# Patient Record
Sex: Female | Born: 1990 | Hispanic: Yes | Marital: Married | Smoking: Never smoker
Health system: Southern US, Community
[De-identification: ages and names within clinical notes are randomized; demographics above are authoritative.]

## PROBLEM LIST (undated history)

## (undated) DIAGNOSIS — Z6791 Unspecified blood type, Rh negative: Secondary | ICD-10-CM

## (undated) DIAGNOSIS — O26899 Other specified pregnancy related conditions, unspecified trimester: Secondary | ICD-10-CM

## (undated) DIAGNOSIS — M419 Scoliosis, unspecified: Secondary | ICD-10-CM

## (undated) HISTORY — PX: NO PAST SURGERIES: SHX2092

## (undated) HISTORY — PX: DILATION AND CURETTAGE OF UTERUS: SHX78

## (undated) HISTORY — DX: Scoliosis, unspecified: M41.9

## (undated) HISTORY — DX: Unspecified blood type, rh negative: Z67.91

## (undated) HISTORY — DX: Other specified pregnancy related conditions, unspecified trimester: O26.899

---

## 2021-11-01 NOTE — L&D Delivery Note (Signed)
Delivery Note  First Stage: Labor onset: 10/17/22  at 2320 Augmentation: PItocin, AROM forebag Analgesia /Anesthesia intrapartum: epidural SROM 10/17/22 at 2320  Second Stage: Complete dilation at 1533 Onset of pushing at 1535 FHR second stage Cat II- variables  Delivery of a viable female infant on 10/18/22 at 1543 by CNM delivery of fetal head in ROA position with restitution to ROT. No nuchal cord;  Anterior then posterior shoulders delivered easily with gentle downward traction. Baby placed on mom's chest, and attended to by peds.  Cord double clamped after cessation of pulsation, cut by FOB Cord blood sample collected    Third Stage: Placenta delivered spontaneously intact with 3VC @ 1546 Placenta disposition: routine disposal Uterine tone Firm / bleeding small  NO vaginal, perineal or cervical lacerations identified  Anesthesia for repair: n/a Repair n/a Est. Blood Loss (mL): 200  Complications: none  Mom to postpartum.  Baby to Couplet care / Skin to Skin.  Newborn: Birth Weight: 6#6  Apgar Scores: 8/9 Feeding planned: breast

## 2021-12-05 ENCOUNTER — Emergency Department
Admission: EM | Admit: 2021-12-05 | Discharge: 2021-12-05 | Disposition: A | Discharge status: Home / Self Care | Payer: Self-pay | Attending: Emergency Medicine | Admitting: Emergency Medicine

## 2021-12-05 DIAGNOSIS — R000 Tachycardia, unspecified: Secondary | ICD-10-CM | POA: Insufficient documentation

## 2021-12-05 DIAGNOSIS — R0602 Shortness of breath: Secondary | ICD-10-CM | POA: Insufficient documentation

## 2021-12-05 DIAGNOSIS — T6111XA Scombroid fish poisoning, accidental (unintentional), initial encounter: Secondary | ICD-10-CM | POA: Insufficient documentation

## 2021-12-05 LAB — CBC WITH DIFFERENTIAL/PLATELET
Abs Immature Granulocytes: 0.03 10*3/uL (ref 0.00–0.07)
Basophils Absolute: 0.1 10*3/uL (ref 0.0–0.1)
Basophils Relative: 1 %
Eosinophils Absolute: 0.1 10*3/uL (ref 0.0–0.5)
Eosinophils Relative: 1 %
HCT: 37.1 % (ref 36.0–46.0)
Hemoglobin: 12.5 g/dL (ref 12.0–15.0)
Immature Granulocytes: 0 %
Lymphocytes Relative: 37 %
Lymphs Abs: 4.6 10*3/uL — ABNORMAL HIGH (ref 0.7–4.0)
MCH: 27.7 pg (ref 26.0–34.0)
MCHC: 33.7 g/dL (ref 30.0–36.0)
MCV: 82.1 fL (ref 80.0–100.0)
Monocytes Absolute: 0.8 10*3/uL (ref 0.1–1.0)
Monocytes Relative: 6 %
Neutro Abs: 7 10*3/uL (ref 1.7–7.7)
Neutrophils Relative %: 55 %
Platelets: 322 10*3/uL (ref 150–400)
RBC: 4.52 MIL/uL (ref 3.87–5.11)
RDW: 11.6 % (ref 11.5–15.5)
WBC: 12.6 10*3/uL — ABNORMAL HIGH (ref 4.0–10.5)
nRBC: 0 % (ref 0.0–0.2)

## 2021-12-05 LAB — COMPREHENSIVE METABOLIC PANEL
ALT: 27 U/L (ref 0–44)
AST: 32 U/L (ref 15–41)
Albumin: 3.9 g/dL (ref 3.5–5.0)
Alkaline Phosphatase: 66 U/L (ref 38–126)
Anion gap: 8 (ref 5–15)
BUN: 20 mg/dL (ref 6–20)
CO2: 21 mmol/L — ABNORMAL LOW (ref 22–32)
Calcium: 8.9 mg/dL (ref 8.9–10.3)
Chloride: 106 mmol/L (ref 98–111)
Creatinine, Ser: 0.63 mg/dL (ref 0.44–1.00)
GFR, Estimated: >60 mL/min (ref >60)
Glucose, Bld: 103 mg/dL — ABNORMAL HIGH (ref 70–99)
Potassium: 3.6 mmol/L (ref 3.5–5.1)
Sodium: 135 mmol/L (ref 135–145)
Total Bilirubin: 0.8 mg/dL (ref 0.3–1.2)
Total Protein: 7.9 g/dL (ref 6.5–8.1)

## 2021-12-05 MED ORDER — FAMOTIDINE IN NACL 20-0.9 MG/50ML-% IV SOLN
20.0000 mg | Freq: Once | INTRAVENOUS | Status: AC
  Administered 2021-12-05: 20 mg via INTRAVENOUS
  Filled 2021-12-05: qty 50

## 2021-12-05 MED ORDER — LACTATED RINGERS IV BOLUS
1000.0000 mL | Freq: Once | INTRAVENOUS | Status: AC
  Administered 2021-12-05: 1000 mL via INTRAVENOUS

## 2021-12-05 MED ORDER — DIPHENHYDRAMINE HCL 50 MG/ML IJ SOLN
25.0000 mg | Freq: Once | INTRAMUSCULAR | Status: AC
  Administered 2021-12-05: 25 mg via INTRAVENOUS
  Filled 2021-12-05: qty 1

## 2021-12-05 NOTE — ED Triage Notes (Signed)
PT arrived via EMS today for a allergic reaction to shrimp. Per EMS pt received of NS with 50mg  of benadryl, 125mg  of Solu-Medrol, 1mg  of Recemic Epi, 0.3mg  of IM Epi, and 4mg  of zofran via a 20g IV in the RAC. Pt is resting on stretcher in NAD, breathing is normal and non labored.

## 2021-12-05 NOTE — Discharge Instructions (Addendum)
Use Benadryl as needed for any further itching or rashes.  Use 1-2 tablets per dose (25-50 mg) and you can take up to 3-4 times per day.  Return to the ED with any worsening symptoms despite these measures.

## 2021-12-05 NOTE — ED Provider Notes (Signed)
[facility] Provider Note    Event Date/Time   First MD Initiated Contact with Patient 12/05/21 1753     (approximate)   History   Allergic Reaction   HPI  Rachel Espinoza is a 31 y.o. female who presents to the ED for evaluation of Allergic Reaction   No hx in our chart.  Patient self-reports no medical history.  No allergies and she has tolerated strep a multitude of times in the past without issue.  She reports eating shrimp at home by herself this evening and within 30 minutes after ingestion, she developed throat closure sensation, hives, perioral paresthesias and shortness of breath.  EMS was called and provided multiple medications to treat anaphylaxis prehospital: 50 mg of IV Benadryl, 125 mg of Solu-Medrol, 1 mg of racemic epi nebulization and 0.3 mg IM epi.  4 mg of Zofran.  She reports feeling better now, just jittery.  No recent illnesses.   Physical Exam   Triage Vital Signs: ED Triage Vitals  Enc Vitals Group     BP      Pulse      Resp      Temp      Temp src      SpO2      Weight      Height      Head Circumference      Peak Flow      Pain Score      Pain Loc      Pain Edu?      Excl. in GC?     Most recent vital signs: Vitals:   12/05/21 1845 12/05/21 1900  BP:  113/70  Pulse: 100 97  Resp: (!) 21 (!) 23  SpO2: 100% 99%    General: Awake, no distress.  Able to open her mouth widely with Mallampati 1 CV:  Good peripheral perfusion.  Tachycardic and regular Resp:  Normal effort.  No wheezing Abd:  No distention.  Soft and benign throughout MSK:  No deformity noted.  No rash or hives noted Neuro:  No focal deficits appreciated. Other:     ED Results / Procedures / Treatments   Labs (all labs ordered are listed, but only abnormal results are displayed) Labs Reviewed  CBC WITH DIFFERENTIAL/PLATELET - Abnormal; Notable for the following components:      Result Value   WBC 12.6 (*)    Lymphs Abs 4.6 (*)     All other components within normal limits  COMPREHENSIVE METABOLIC PANEL - Abnormal; Notable for the following components:   CO2 21 (*)    Glucose, Bld 103 (*)    All other components within normal limits    EKG Sinus tachycardia with a rate of 111 bpm.  Normal axis and intervals.  No evidence of acute ischemia.  RADIOLOGY   Official radiology report(s): No results found.  PROCEDURES and INTERVENTIONS:  .1-3 Lead EKG Interpretation Performed by: Delton Prairie, MD Authorized by: Delton Prairie, MD     Interpretation: abnormal     ECG rate:  104   ECG rate assessment: tachycardic     Rhythm: sinus tachycardia     Ectopy: none     Conduction: normal    Medications  lactated ringers bolus 1,000 mL (1,000 mLs Intravenous New Bag/Given 12/05/21 1805)  famotidine (PEPCID) IVPB 20 mg premix (0 mg Intravenous Stopped 12/05/21 1834)     IMPRESSION / MDM / ASSESSMENT AND PLAN / ED COURSE  I reviewed the  triage vital signs and the nursing notes.  Healthy 31 year old female presents to the ED with allergic reaction after ingesting shrimp, likely scombroid and requiring observation in the ED due to prehospital epinephrine.  She is somewhat jittery and tachycardic on arrival, anticipate this is due to the epinephrine she received prehospital.  She has no evidence of persistent allergic reaction, hives, anaphylaxis or need for recurrent doses of epi at this time.  We will round out her antihistamine medications with famotidine IV as we observe her for a couple hours to ensure no need for repeat dosing of these medications.  Clinical Course as of 12/05/21 1959  Sat Dec 05, 2021  1920 Reassessed. Feeling well [DS]  1959 Reassessed.  Feeling well.  We again discussed scombroid.  Benadryl at home and return precautions for the ED. [DS]    Clinical Course User Index [DS] Delton Prairie, MD     FINAL CLINICAL IMPRESSION(S) / ED DIAGNOSES   Final diagnoses:  Unintentional scombroid fish  poisoning, initial encounter     Rx / DC Orders   ED Discharge Orders     None        Note:  This document was prepared using Dragon voice recognition software and may include unintentional dictation errors.   Delton Prairie, MD 12/05/21 [phone]

## 2022-04-12 ENCOUNTER — Encounter: Payer: Self-pay | Admitting: Family Medicine

## 2022-04-12 ENCOUNTER — Ambulatory Visit: Payer: Medicaid Other | Admitting: Family Medicine

## 2022-04-12 VITALS — BP 102/63 | HR 85 | Temp 97.6°F | Ht 58.5 in | Wt 158.2 lb

## 2022-04-12 DIAGNOSIS — M419 Scoliosis, unspecified: Secondary | ICD-10-CM | POA: Diagnosis not present

## 2022-04-12 DIAGNOSIS — F32A Depression, unspecified: Secondary | ICD-10-CM

## 2022-04-12 DIAGNOSIS — O0991 Supervision of high risk pregnancy, unspecified, first trimester: Secondary | ICD-10-CM

## 2022-04-12 DIAGNOSIS — Z91410 Personal history of adult physical and sexual abuse: Secondary | ICD-10-CM | POA: Insufficient documentation

## 2022-04-12 DIAGNOSIS — O99211 Obesity complicating pregnancy, first trimester: Secondary | ICD-10-CM | POA: Diagnosis not present

## 2022-04-12 DIAGNOSIS — O0993 Supervision of high risk pregnancy, unspecified, third trimester: Secondary | ICD-10-CM | POA: Insufficient documentation

## 2022-04-12 DIAGNOSIS — O09213 Supervision of pregnancy with history of pre-term labor, third trimester: Secondary | ICD-10-CM | POA: Insufficient documentation

## 2022-04-12 HISTORY — DX: Depression, unspecified: F32.A

## 2022-04-12 LAB — URINALYSIS
Bilirubin, UA: NEGATIVE
Glucose, UA: NEGATIVE
Ketones, UA: NEGATIVE
Leukocytes,UA: NEGATIVE
Nitrite, UA: NEGATIVE
Protein,UA: NEGATIVE
RBC, UA: NEGATIVE
Specific Gravity, UA: 1.03 (ref 1.005–1.030)
Urobilinogen, Ur: 0.2 mg/dL (ref 0.2–1.0)
pH, UA: 5.5 (ref 5.0–7.5)

## 2022-04-12 LAB — WET PREP FOR TRICH, YEAST, CLUE
Trichomonas Exam: NEGATIVE
Yeast Exam: NEGATIVE

## 2022-04-12 LAB — HEMOGLOBIN, FINGERSTICK: Hemoglobin: 13.3 g/dL (ref 11.1–15.9)

## 2022-04-12 NOTE — Progress Notes (Signed)
BMI 31.8. Patient ingested glucola within allotted timeframe and tolerated well.   Patient declines completion of IPV questions. Provider aware. [facility] Spanish utilized during visit .  [place] Spanish Interpretor utilized during visit [phone]). Patient changed delivery provider to [facility]. [place] OB packet given and discussed contents of packet. Scheduled for return OB visit in 4 weeks. Provider aware of In House labs.  Alesia Banda RN
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
## 2022-04-13 ENCOUNTER — Emergency Department
Admission: EM | Admit: 2022-04-13 | Discharge: 2022-04-13 | Disposition: A | Discharge status: Home / Self Care | Payer: Self-pay | Attending: Student in an Organized Health Care Education/Training Program | Admitting: Student in an Organized Health Care Education/Training Program

## 2022-04-13 ENCOUNTER — Encounter: Payer: Self-pay | Admitting: Intensive Care

## 2022-04-13 ENCOUNTER — Other Ambulatory Visit: Payer: Self-pay

## 2022-04-13 ENCOUNTER — Emergency Department: Payer: Self-pay

## 2022-04-13 DIAGNOSIS — Z3A11 11 weeks gestation of pregnancy: Secondary | ICD-10-CM | POA: Insufficient documentation

## 2022-04-13 DIAGNOSIS — O4690 Antepartum hemorrhage, unspecified, unspecified trimester: Secondary | ICD-10-CM

## 2022-04-13 DIAGNOSIS — O208 Other hemorrhage in early pregnancy: Secondary | ICD-10-CM | POA: Insufficient documentation

## 2022-04-13 DIAGNOSIS — O418X11 Other specified disorders of amniotic fluid and membranes, first trimester, fetus 1: Secondary | ICD-10-CM

## 2022-04-13 LAB — COMPREHENSIVE METABOLIC PANEL
ALT: 21 U/L (ref 0–44)
AST: 21 U/L (ref 15–41)
Albumin: 3.7 g/dL (ref 3.5–5.0)
Alkaline Phosphatase: 57 U/L (ref 38–126)
Anion gap: 6 (ref 5–15)
BUN: 11 mg/dL (ref 6–20)
CO2: 21 mmol/L — ABNORMAL LOW (ref 22–32)
Calcium: 9.1 mg/dL (ref 8.9–10.3)
Chloride: 108 mmol/L (ref 98–111)
Creatinine, Ser: 0.38 mg/dL — ABNORMAL LOW (ref 0.44–1.00)
GFR, Estimated: >60 mL/min (ref >60)
Glucose, Bld: 83 mg/dL (ref 70–99)
Potassium: 4.4 mmol/L (ref 3.5–5.1)
Sodium: 135 mmol/L (ref 135–145)
Total Bilirubin: 0.4 mg/dL (ref 0.3–1.2)
Total Protein: 7.5 g/dL (ref 6.5–8.1)

## 2022-04-13 LAB — URINALYSIS, ROUTINE W REFLEX MICROSCOPIC
Bilirubin Urine: NEGATIVE
Glucose, UA: NEGATIVE mg/dL
Hgb urine dipstick: NEGATIVE
Ketones, ur: NEGATIVE mg/dL
Leukocytes,Ua: NEGATIVE
Nitrite: NEGATIVE
Protein, ur: NEGATIVE mg/dL
Specific Gravity, Urine: 1.025 (ref 1.005–1.030)
pH: 5 (ref 5.0–8.0)

## 2022-04-13 LAB — CBC
HCT: 36.9 % (ref 36.0–46.0)
Hemoglobin: 12.3 g/dL (ref 12.0–15.0)
MCH: 27.3 pg (ref 26.0–34.0)
MCHC: 33.3 g/dL (ref 30.0–36.0)
MCV: 82 fL (ref 80.0–100.0)
Platelets: 294 10*3/uL (ref 150–400)
RBC: 4.5 MIL/uL (ref 3.87–5.11)
RDW: 12.3 % (ref 11.5–15.5)
WBC: 11.7 10*3/uL — ABNORMAL HIGH (ref 4.0–10.5)
nRBC: 0 % (ref 0.0–0.2)

## 2022-04-13 LAB — PROTEIN / CREATININE RATIO, URINE
Creatinine, Urine: 96.8 mg/dL
Protein, Ur: 9.5 mg/dL
Protein/Creat Ratio: 98 mg/g creat (ref 0–200)

## 2022-04-13 LAB — HCG, QUANTITATIVE, PREGNANCY: hCG, Beta Chain, Quant, S: 61870 m[IU]/mL — ABNORMAL HIGH (ref <5)

## 2022-04-13 LAB — POC URINE PREG, ED: Preg Test, Ur: POSITIVE — AB

## 2022-04-13 LAB — ABO/RH: ABO/RH(D): O NEG

## 2022-04-13 LAB — LIPASE, BLOOD: Lipase: 34 U/L (ref 11–51)

## 2022-04-13 MED ORDER — ACETAMINOPHEN 325 MG PO TABS
650.0000 mg | ORAL_TABLET | Freq: Once | ORAL | Status: AC
  Administered 2022-04-13: 650 mg via ORAL
  Filled 2022-04-13: qty 2

## 2022-04-13 NOTE — ED Provider Notes (Signed)
[facility] Provider Note    Event Date/Time   First MD Initiated Contact with Patient 04/13/22 1357     (approximate)   History   Threatened Miscarriage   HPI  Rachel Espinoza is a 31 y.o. female  [phone] roughly [redacted] weeks pregnant presents to the ER for evaluation of 36 hours of pelvic cramping and vaginal bleeding.  States is primarily spotting.  Had follow-up with her [place] yesterday had exam that was otherwise reassuring but they were unable to auscultate fetal heart tones therefore given the bleeding and pain there was concern for miscarriage and they recommended follow-up in the ER.        Physical Exam   Triage Vital Signs: ED Triage Vitals  Enc Vitals Group     BP 04/13/22 1339 112/72     Pulse Rate 04/13/22 1339 94     Resp 04/13/22 1339 16     Temp 04/13/22 1339 98.3 F (36.8 C)     Temp Source 04/13/22 1339 Oral     SpO2 04/13/22 1339 100 %     Weight 04/13/22 1342 158 lb (71.7 kg)     Height 04/13/22 1342 5' 0.24" (1.53 m)     Head Circumference --      Peak Flow --      Pain Score 04/13/22 1341 7     Pain Loc --      Pain Edu? --      Excl. in [place]? --     Most recent vital signs: Vitals:   04/13/22 1339 04/13/22 1509  BP: 112/72 112/70  Pulse: 94 88  Resp: 16 16  Temp: 98.3 F (36.8 C)   SpO2: 100% 100%     Constitutional: Alert  Eyes: Conjunctivae are normal.  Head: Atraumatic. Nose: No congestion/rhinnorhea. Mouth/Throat: Mucous membranes are moist.   Neck: Painless ROM.  Cardiovascular:   Good peripheral circulation. Respiratory: Normal respiratory effort.  No retractions.  Gastrointestinal: Soft, gravid, and nontender  Musculoskeletal:  no deformity Neurologic:  MAE spontaneously. No gross focal neurologic deficits are appreciated.  Skin:  Skin is warm, dry and intact. No rash noted. Psychiatric: Mood and affect are normal. Speech and behavior are normal.    ED Results /  Procedures / Treatments   Labs (all labs ordered are listed, but only abnormal results are displayed) Labs Reviewed  COMPREHENSIVE METABOLIC PANEL - Abnormal; Notable for the following components:      Result Value   CO2 21 (*)    Creatinine, Ser 0.38 (*)    All other components within normal limits  CBC - Abnormal; Notable for the following components:   WBC 11.7 (*)    All other components within normal limits  URINALYSIS, ROUTINE W REFLEX MICROSCOPIC - Abnormal; Notable for the following components:   Color, Urine YELLOW (*)    APPearance CLEAR (*)    All other components within normal limits  HCG, QUANTITATIVE, PREGNANCY - Abnormal; Notable for the following components:   hCG, Beta Chain, Quant, S 61,870 (*)    All other components within normal limits  POC URINE PREG, ED - Abnormal; Notable for the following components:   Preg Test, Ur POSITIVE (*)    All other components within normal limits  LIPASE, BLOOD  ABO/RH     EKG     RADIOLOGY Please see ED Course for my review and interpretation.  I personally reviewed all radiographic images ordered to evaluate for the  above acute complaints and reviewed radiology reports and findings.  These findings were personally discussed with the patient.  Please see medical record for radiology report.    PROCEDURES:  Critical Care performed: No  Procedures   MEDICATIONS ORDERED IN ED: Medications  acetaminophen (TYLENOL) tablet 650 mg (650 mg Oral Given 04/13/22 1415)     IMPRESSION / MDM / ASSESSMENT AND PLAN / ED COURSE  I reviewed the triage vital signs and the nursing notes.                              Differential diagnosis includes, but is not limited to, ectopic, threatened AB, miscarriage, AUB, abruption  Presented to the ER for evaluation of symptoms as described above.  Clinically well-appearing in no acute distress.  Will check blood work.  Will order ultrasound to evaluate for the but differential.  This  presenting complaint could reflect a potentially life-threatening illness.    Clinical Course as of 04/13/22 1511  Tue Apr 13, 2022  1448 US pelvis by my interpretation does show evidence of IUP with reassuring fetal heart tones. [PR]  1509 Ultrasound reassuring.  Discussed findings of subchorionic hematoma.  Patient hemodynamically stable.  No additional questions or concerns. [PR]    Clinical Course User Index [PR] Merlyn Lot, MD      FINAL CLINICAL IMPRESSION(S) / ED DIAGNOSES   Final diagnoses:  Vaginal bleeding in pregnancy  Subchorionic hematoma in first trimester, fetus 1 of multiple gestation     Rx / DC Orders   ED Discharge Orders     None        Note:  This document was prepared using Dragon voice recognition software and may include unintentional dictation errors.     Merlyn Lot, MD 04/13/22 1511

## 2022-04-13 NOTE — ED Triage Notes (Signed)
Patient reports going to her OBGYN appointment yesterday at 11weeks and they could not find a heartbeat and was told to come to ER. C/o lower pelvic pain X3 days and vaginal spotting X1 day

## 2022-04-13 NOTE — Discharge Instructions (Signed)
LO QUE NECESITA SABER: Una hemorragia subcorinica, o hematoma, es una acumulacin de [employer] la placenta y Careers information officer. Esta hemorragia generalmente se desarrolla a finales del primer trimestre. El sangrado casi siempre es absorbido por su propio cuerpo usualmente a las 20 semanas de [place]. La mayora de los embarazos progresan sin problemas. Es probable que [employer] ocasional o sangrado ligero a lo largo de Engineer, civil (consulting). INSTRUCCIONES SOBRE EL ALTA HOSPITALARIA: Regrese a la sala de emergencias si: Tiene fiebre. Usted tiene dolor abdominal. Usted tiene un aumento repentino en el sangrado. Comunquese con su mdico si: Su sangrado ha aumentado. Usted tiene preguntas o inquietudes acerca de su condicin o cuidado. Descanso plvico: No tenga relaciones sexuales, no tome duchas vaginales ni use tampones. No haga esfuerzo ni alce objetos pesados. Estas actividades pueden ocasionar contracciones o infecciones y Hospital doctor en riesgo su vida y la de su beb. Es probable que usted necesite descansar ms de lo normal. Haga sus actividades diarias segn indicaciones dadas. Acuda a sus consultas de control con su mdico segn le indicaron. Es posible que necesite regresar con frecuencia para Engineer, structural. Anote sus preguntas para que se acuerde de hacerlas durante sus visitas.

## 2022-04-13 NOTE — ED Notes (Signed)
See triage note  presents with some lower abd cramping and pain denies any vaginal bleeding  states she is [redacted] weeks pregnant  was told that her OB was not able to hear any heart tones

## 2022-04-13 NOTE — Addendum Note (Signed)
Addended by: Wendi Snipes on: 04/13/2022 05:40 PM   Modules accepted: Orders

## 2022-04-14 LAB — CHLAMYDIA/GC NAA, CONFIRMATION
Chlamydia trachomatis, NAA: NEGATIVE
Neisseria gonorrhoeae, NAA: NEGATIVE

## 2022-04-14 LAB — URINE CULTURE

## 2022-04-15 DIAGNOSIS — O26899 Other specified pregnancy related conditions, unspecified trimester: Secondary | ICD-10-CM | POA: Insufficient documentation

## 2022-04-15 LAB — COMPREHENSIVE METABOLIC PANEL
ALT: 20 IU/L (ref 0–32)
AST: 20 IU/L (ref 0–40)
Albumin/Globulin Ratio: 1.3 (ref 1.2–2.2)
Albumin: 4.4 g/dL (ref 3.9–5.0)
Alkaline Phosphatase: 82 IU/L (ref 44–121)
BUN/Creatinine Ratio: 23 (ref 9–23)
BUN: 11 mg/dL (ref 6–20)
Bilirubin Total: <0.2 mg/dL (ref 0.0–1.2)
CO2: 19 mmol/L — ABNORMAL LOW (ref 20–29)
Calcium: 9.8 mg/dL (ref 8.7–10.2)
Chloride: 98 mmol/L (ref 96–106)
Creatinine, Ser: 0.48 mg/dL — ABNORMAL LOW (ref 0.57–1.00)
Globulin, Total: 3.4 g/dL (ref 1.5–4.5)
Glucose: 112 mg/dL — ABNORMAL HIGH (ref 70–99)
Potassium: 3.8 mmol/L (ref 3.5–5.2)
Sodium: 135 mmol/L (ref 134–144)
Total Protein: 7.8 g/dL (ref 6.0–8.5)
eGFR: 131 mL/min/{1.73_m2} (ref >59)

## 2022-04-15 LAB — QUANTIFERON-TB GOLD PLUS
QuantiFERON Mitogen Value: >10 IU/mL
QuantiFERON Nil Value: 0.03 IU/mL
QuantiFERON TB1 Ag Value: 0.04 IU/mL
QuantiFERON TB2 Ag Value: 0.03 IU/mL
QuantiFERON-TB Gold Plus: NEGATIVE

## 2022-04-15 LAB — CBC/D/PLT+RPR+RH+ABO+RUB AB...
Antibody Screen: NEGATIVE
Basophils Absolute: 0 10*3/uL (ref 0.0–0.2)
Basos: 0 %
EOS (ABSOLUTE): 0.1 10*3/uL (ref 0.0–0.4)
Eos: 1 %
Hematocrit: 40.5 % (ref 34.0–46.6)
Hemoglobin: 13.7 g/dL (ref 11.1–15.9)
Hepatitis B Surface Ag: NEGATIVE
Immature Grans (Abs): 0 10*3/uL (ref 0.0–0.1)
Immature Granulocytes: 0 %
Lymphocytes Absolute: 2.4 10*3/uL (ref 0.7–3.1)
Lymphs: 21 %
MCH: 28.1 pg (ref 26.6–33.0)
MCHC: 33.8 g/dL (ref 31.5–35.7)
MCV: 83 fL (ref 79–97)
Monocytes Absolute: 0.6 10*3/uL (ref 0.1–0.9)
Monocytes: 5 %
Neutrophils Absolute: 8.5 10*3/uL — ABNORMAL HIGH (ref 1.4–7.0)
Neutrophils: 73 %
Platelets: 304 10*3/uL (ref 150–450)
RBC: 4.87 x10E6/uL (ref 3.77–5.28)
RDW: 12.7 % (ref 11.7–15.4)
RPR Ser Ql: NONREACTIVE
Rh Factor: NEGATIVE
Rubella Antibodies, IGG: 5.11 index (ref >0.99)
Varicella zoster IgG: 1723 index (ref >165)
WBC: 11.6 10*3/uL — ABNORMAL HIGH (ref 3.4–10.8)

## 2022-04-15 LAB — TSH: TSH: 0.84 u[IU]/mL (ref 0.450–4.500)

## 2022-04-15 LAB — HIV-1/HIV-2 QUALITATIVE RNA
HIV-1 RNA, Qualitative: NONREACTIVE
HIV-2 RNA, Qualitative: NONREACTIVE

## 2022-04-15 LAB — HGB A1C W/O EAG: Hgb A1c MFr Bld: 5.1 % (ref 4.8–5.6)

## 2022-04-15 LAB — HCV INTERPRETATION

## 2022-04-15 LAB — HCV AB W REFLEX TO QUANT PCR: HCV Ab: NONREACTIVE

## 2022-04-15 LAB — SICKLE CELL SCREEN: Sickle Cell Screen: NEGATIVE

## 2022-04-15 LAB — GLUCOSE, 1 HOUR GESTATIONAL: Gestational Diabetes Screen: 97 mg/dL (ref 70–139)

## 2022-04-16 ENCOUNTER — Telehealth: Payer: Self-pay

## 2022-04-16 NOTE — Telephone Encounter (Signed)
Electronic referral for [facility] [place] for OB complete < 14 weeks ordered. Unable to be scheduled in [place] as schedule full. Call to [facility] in [place] and [place] scheduled for 04/27/2022 at 1015 with arrival time of 1000.  Call to client with Marlene Yemen and above [place] appt information given. Client given facility name and address. Jossie Ng, RN

## 2022-04-22 NOTE — Addendum Note (Signed)
Addended by: Heywood Bene on: 04/22/2022 02:29 PM   Modules accepted: Orders

## 2022-04-27 ENCOUNTER — Ambulatory Visit: Payer: Self-pay | Admitting: *Deleted

## 2022-04-27 ENCOUNTER — Encounter: Payer: Self-pay | Admitting: *Deleted

## 2022-04-27 ENCOUNTER — Ambulatory Visit: Payer: Self-pay | Attending: Family Medicine

## 2022-04-27 ENCOUNTER — Other Ambulatory Visit: Payer: Self-pay | Admitting: *Deleted

## 2022-04-27 ENCOUNTER — Encounter: Payer: Self-pay | Admitting: Advanced Practice Midwife

## 2022-04-27 VITALS — BP 114/65 | HR 98

## 2022-04-27 DIAGNOSIS — O26899 Other specified pregnancy related conditions, unspecified trimester: Secondary | ICD-10-CM | POA: Insufficient documentation

## 2022-04-27 DIAGNOSIS — O0991 Supervision of high risk pregnancy, unspecified, first trimester: Secondary | ICD-10-CM

## 2022-04-27 DIAGNOSIS — O09891 Supervision of other high risk pregnancies, first trimester: Secondary | ICD-10-CM

## 2022-04-27 DIAGNOSIS — O99211 Obesity complicating pregnancy, first trimester: Secondary | ICD-10-CM

## 2022-04-27 DIAGNOSIS — Z6791 Unspecified blood type, Rh negative: Secondary | ICD-10-CM | POA: Insufficient documentation

## 2022-04-27 DIAGNOSIS — O09213 Supervision of pregnancy with history of pre-term labor, third trimester: Secondary | ICD-10-CM

## 2022-04-27 DIAGNOSIS — O283 Abnormal ultrasonic finding on antenatal screening of mother: Secondary | ICD-10-CM

## 2022-05-11 ENCOUNTER — Telehealth: Payer: Self-pay

## 2022-05-11 ENCOUNTER — Ambulatory Visit: Payer: Medicaid Other

## 2022-05-11 NOTE — Telephone Encounter (Signed)
Call to client with Rachel Espinoza as [name] for [facility] RV today. Per client, she forgot appt. Appt rescheduled for 05/20/2022 with arrival time of 1020. Jossie Ng, RN

## 2022-05-19 ENCOUNTER — Ambulatory Visit: Payer: Medicaid Other | Admitting: Licensed Clinical Social Worker

## 2022-05-20 ENCOUNTER — Encounter: Payer: Self-pay | Admitting: Physician Assistant

## 2022-05-20 ENCOUNTER — Ambulatory Visit: Payer: Medicaid Other | Admitting: Physician Assistant

## 2022-05-20 VITALS — BP 98/57 | HR 92 | Temp 98.1°F | Wt 159.0 lb

## 2022-05-20 DIAGNOSIS — O99211 Obesity complicating pregnancy, first trimester: Secondary | ICD-10-CM

## 2022-05-20 DIAGNOSIS — O99212 Obesity complicating pregnancy, second trimester: Secondary | ICD-10-CM

## 2022-05-20 DIAGNOSIS — O208 Other hemorrhage in early pregnancy: Secondary | ICD-10-CM | POA: Insufficient documentation

## 2022-05-20 DIAGNOSIS — O0992 Supervision of high risk pregnancy, unspecified, second trimester: Secondary | ICD-10-CM | POA: Diagnosis not present

## 2022-05-20 DIAGNOSIS — O418X11 Other specified disorders of amniotic fluid and membranes, first trimester, fetus 1: Secondary | ICD-10-CM

## 2022-05-20 DIAGNOSIS — O0991 Supervision of high risk pregnancy, unspecified, first trimester: Secondary | ICD-10-CM

## 2022-05-20 DIAGNOSIS — O418X10 Other specified disorders of amniotic fluid and membranes, first trimester, not applicable or unspecified: Secondary | ICD-10-CM | POA: Insufficient documentation

## 2022-05-20 DIAGNOSIS — O468X1 Other antepartum hemorrhage, first trimester: Secondary | ICD-10-CM

## 2022-05-20 NOTE — Progress Notes (Signed)
Patient is here for MH RV at [redacted]w[redacted]d.   Patient states she is taking ASA 81mg  but forgets to take it daily. Encouraged importance of taking iron daily. Patient states she is taking PNV daily, no refills needed today.   Labs ordered today: MaterniT21, AFP open spina bifida, hemoglobinopathy, and antibody screen (per Dr. recommendation from ultrasound on 04/27/22). Patient aware she has Anatomy Ultrasound on 06/21/22 in [place].   Rh negative education done today and education sheet given.   Patient desires BTL PP. Patient has no health insurance (presumptive at the moment). [facility] application given and BTL booklet information given.   [facility], RN
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
## 2022-05-21 LAB — ANTIBODY SCREEN: Antibody Screen: NEGATIVE

## 2022-05-22 LAB — AFP, SERUM, OPEN SPINA BIFIDA
AFP MoM: 0.66
AFP Value: 20.4 ng/mL
Gest. Age on Collection Date: 15.4 weeks
Maternal Age At EDD: 31.4 yr
OSBR Risk 1 IN: 10000
Test Results:: NEGATIVE
Weight: 159 [lb_av]

## 2022-05-24 LAB — MATERNIT 21 PLUS CORE, BLOOD
Fetal Fraction: 9
Result (T21): NEGATIVE
Trisomy 13 (Patau syndrome): NEGATIVE
Trisomy 18 (Edwards syndrome): NEGATIVE
Trisomy 21 (Down syndrome): NEGATIVE

## 2022-05-24 LAB — HGB FRACTIONATION CASCADE
Hgb A2: 2.8 % (ref 1.8–3.2)
Hgb A: 97.2 % (ref 96.4–98.8)
Hgb F: 0 % (ref 0.0–2.0)
Hgb S: 0 %

## 2022-06-07 ENCOUNTER — Emergency Department
Admission: EM | Admit: 2022-06-07 | Discharge: 2022-06-08 | Disposition: A | Discharge status: Home / Self Care | Payer: Self-pay | Attending: Emergency Medicine | Admitting: Emergency Medicine

## 2022-06-07 DIAGNOSIS — R109 Unspecified abdominal pain: Secondary | ICD-10-CM

## 2022-06-07 DIAGNOSIS — U071 COVID-19: Secondary | ICD-10-CM | POA: Insufficient documentation

## 2022-06-07 DIAGNOSIS — O23592 Infection of other part of genital tract in pregnancy, second trimester: Secondary | ICD-10-CM | POA: Insufficient documentation

## 2022-06-07 DIAGNOSIS — N898 Other specified noninflammatory disorders of vagina: Secondary | ICD-10-CM | POA: Insufficient documentation

## 2022-06-07 DIAGNOSIS — O98512 Other viral diseases complicating pregnancy, second trimester: Secondary | ICD-10-CM | POA: Insufficient documentation

## 2022-06-07 DIAGNOSIS — B3731 Acute candidiasis of vulva and vagina: Secondary | ICD-10-CM | POA: Insufficient documentation

## 2022-06-07 DIAGNOSIS — O26892 Other specified pregnancy related conditions, second trimester: Secondary | ICD-10-CM | POA: Insufficient documentation

## 2022-06-07 DIAGNOSIS — O26899 Other specified pregnancy related conditions, unspecified trimester: Secondary | ICD-10-CM | POA: Insufficient documentation

## 2022-06-07 DIAGNOSIS — Z3A18 18 weeks gestation of pregnancy: Secondary | ICD-10-CM | POA: Insufficient documentation

## 2022-06-07 LAB — COMPREHENSIVE METABOLIC PANEL
ALT: 23 U/L (ref 0–44)
AST: 24 U/L (ref 15–41)
Albumin: 3.9 g/dL (ref 3.5–5.0)
Alkaline Phosphatase: 62 U/L (ref 38–126)
Anion gap: 9 (ref 5–15)
BUN: 7 mg/dL (ref 6–20)
CO2: 20 mmol/L — ABNORMAL LOW (ref 22–32)
Calcium: 9.1 mg/dL (ref 8.9–10.3)
Chloride: 103 mmol/L (ref 98–111)
Creatinine, Ser: 0.46 mg/dL (ref 0.44–1.00)
GFR, Estimated: >60 mL/min (ref >60)
Glucose, Bld: 88 mg/dL (ref 70–99)
Potassium: 3.8 mmol/L (ref 3.5–5.1)
Sodium: 132 mmol/L — ABNORMAL LOW (ref 135–145)
Total Bilirubin: 0.5 mg/dL (ref 0.3–1.2)
Total Protein: 8 g/dL (ref 6.5–8.1)

## 2022-06-07 LAB — CBC WITH DIFFERENTIAL/PLATELET
Abs Immature Granulocytes: 0.05 10*3/uL (ref 0.00–0.07)
Basophils Absolute: 0 10*3/uL (ref 0.0–0.1)
Basophils Relative: 0 %
Eosinophils Absolute: 0.1 10*3/uL (ref 0.0–0.5)
Eosinophils Relative: 1 %
HCT: 38.9 % (ref 36.0–46.0)
Hemoglobin: 13 g/dL (ref 12.0–15.0)
Immature Granulocytes: 1 %
Lymphocytes Relative: 6 %
Lymphs Abs: 0.6 10*3/uL — ABNORMAL LOW (ref 0.7–4.0)
MCH: 27.8 pg (ref 26.0–34.0)
MCHC: 33.4 g/dL (ref 30.0–36.0)
MCV: 83.3 fL (ref 80.0–100.0)
Monocytes Absolute: 0.5 10*3/uL (ref 0.1–1.0)
Monocytes Relative: 5 %
Neutro Abs: 8.8 10*3/uL — ABNORMAL HIGH (ref 1.7–7.7)
Neutrophils Relative %: 87 %
Platelets: 222 10*3/uL (ref 150–400)
RBC: 4.67 MIL/uL (ref 3.87–5.11)
RDW: 12.9 % (ref 11.5–15.5)
WBC: 9.9 10*3/uL (ref 4.0–10.5)
nRBC: 0 % (ref 0.0–0.2)

## 2022-06-07 LAB — CHLAMYDIA/NGC RT PCR (ARMC ONLY)
Chlamydia Tr: NOT DETECTED
N gonorrhoeae: NOT DETECTED

## 2022-06-07 LAB — URINALYSIS, ROUTINE W REFLEX MICROSCOPIC
Bilirubin Urine: NEGATIVE
Glucose, UA: NEGATIVE mg/dL
Hgb urine dipstick: NEGATIVE
Ketones, ur: NEGATIVE mg/dL
Nitrite: NEGATIVE
Protein, ur: NEGATIVE mg/dL
Specific Gravity, Urine: 1.023 (ref 1.005–1.030)
pH: 5 (ref 5.0–8.0)

## 2022-06-07 LAB — WET PREP, GENITAL
Clue Cells Wet Prep HPF POC: NONE SEEN
Sperm: NONE SEEN
Trich, Wet Prep: NONE SEEN
WBC, Wet Prep HPF POC: >=10 — AB (ref <10)

## 2022-06-07 LAB — HCG, QUANTITATIVE, PREGNANCY: hCG, Beta Chain, Quant, S: 4963 m[IU]/mL — ABNORMAL HIGH (ref <5)

## 2022-06-07 LAB — RESP PANEL BY RT-PCR (FLU A&B, COVID) ARPGX2
Influenza A by PCR: NEGATIVE
Influenza B by PCR: NEGATIVE
SARS Coronavirus 2 by RT PCR: POSITIVE — AB

## 2022-06-07 LAB — TYPE AND SCREEN
ABO/RH(D): O NEG
Antibody Screen: NEGATIVE

## 2022-06-07 MED ORDER — CLOTRIMAZOLE 1 % VA CREA
1.0000 | TOPICAL_CREAM | Freq: Every day | VAGINAL | 0 refills | Status: AC
Start: 2022-06-07 — End: 2022-06-14

## 2022-06-07 MED ORDER — SODIUM CHLORIDE 0.9 % IV BOLUS
1000.0000 mL | Freq: Once | INTRAVENOUS | Status: AC
  Administered 2022-06-07: 1000 mL via INTRAVENOUS

## 2022-06-07 MED ORDER — METOCLOPRAMIDE HCL 10 MG PO TABS
10.0000 mg | ORAL_TABLET | Freq: Once | ORAL | Status: AC
  Administered 2022-06-07: 10 mg via ORAL
  Filled 2022-06-07: qty 1

## 2022-06-07 MED ORDER — ACETAMINOPHEN 500 MG PO TABS
1000.0000 mg | ORAL_TABLET | Freq: Once | ORAL | Status: AC
  Administered 2022-06-07: 1000 mg via ORAL

## 2022-06-07 MED ORDER — ACETAMINOPHEN 500 MG PO TABS
ORAL_TABLET | ORAL | Status: AC
  Filled 2022-06-07: qty 2
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
## 2022-06-07 NOTE — ED Provider Triage Note (Signed)
Emergency Medicine Provider Triage Evaluation Note  [facility] Rachel Espinoza , a 31 y.o. female  was evaluated in triage.  Pt complains of low abdominal pain with radiation into right flank. Also has had yellow discharge that is now red tinged. [redacted] weeks pregnant. G5P4.  Physical Exam  BP (!) 127/91 (BP Location: Left Arm)   Pulse (!) 138   Resp 18   Wt 72.1 kg   LMP 01/23/2022 (Exact Date)   SpO2 100%   BMI 30.81 kg/m  Gen:   Awake, no distress   Resp:  Normal effort  MSK:   Moves extremities without difficulty  Other:    Medical Decision Making  Medically screening exam initiated at 2:48 PM.  Appropriate orders placed.  Rachel Espinoza was informed that the remainder of the evaluation will be completed by another provider, this initial triage assessment does not replace that evaluation, and the importance of remaining in the ED until their evaluation is complete.    Chinita Pester, FNP 06/07/22 1451

## 2022-06-07 NOTE — Consult Note (Signed)
Reason for Consult: lower abdominal pain , back pain , vaginal discharge in pregnancy  Referring Physician: Dr. Mikki Santee [facility] Rachel Espinoza is an 31 y.o. female. G7P5 18 weeks 1 day, that presented to ED for lower abdominal pain , back pain , and greenish vaginal discharge. Concern for PID.      Past Medical History:  Diagnosis Date   Depression 04/12/2022   Rh negative state in antepartum period     Past Surgical History:  Procedure Laterality Date   DILATION AND CURETTAGE OF UTERUS     NO PAST SURGERIES      Family History  Problem Relation Age of Onset   Thyroid disease Mother    Healthy Father    Healthy Sister    Healthy Brother    Healthy Daughter    Healthy Son     Social History:  reports that she has never smoked. She has never been exposed to tobacco smoke. She has never used smokeless tobacco. She reports that she does not currently use alcohol. She reports that she does not use drugs.  Allergies:  Allergies  Allergen Reactions   Shrimp Flavor Shortness Of Breath    Allergic  to Shrimp    Medications: I have reviewed the patient's current medications.  Review of Systems  Blood pressure 103/61, pulse 88, temperature 98.1 F (36.7 C), temperature source Oral, resp. rate 18, weight 72.1 kg, last menstrual period 01/23/2022, SpO2 98 %. Physical Exam  Results for orders placed or performed during the hospital encounter of 06/07/22 (from the past 48 hour(s))  Urinalysis, Routine w reflex microscopic     Status: Abnormal   Collection Time: 06/07/22  2:26 PM  Result Value Ref Range   Color, Urine YELLOW (A) YELLOW   APPearance HAZY (A) CLEAR   Specific Gravity, Urine 1.023 1.005 - 1.030   pH 5.0 5.0 - 8.0   Glucose, UA NEGATIVE NEGATIVE mg/dL   Hgb urine dipstick NEGATIVE NEGATIVE   Bilirubin Urine NEGATIVE NEGATIVE   Ketones, ur NEGATIVE NEGATIVE mg/dL   Protein, ur NEGATIVE NEGATIVE mg/dL   Nitrite NEGATIVE NEGATIVE   Leukocytes,Ua LARGE (A)  NEGATIVE   RBC / HPF 0-5 0 - 5 RBC/hpf   WBC, UA 0-5 0 - 5 WBC/hpf   Bacteria, UA RARE (A) NONE SEEN   Squamous Epithelial / LPF 0-5 0 - 5   Mucus PRESENT     Comment: Performed at [facility], [address] [place], [place] [address]  Type and screen [facility]     Status: None   Collection Time: 06/07/22  2:45 PM  Result Value Ref Range   ABO/RH(D) O NEG    Antibody Screen NEG    Sample Expiration      06/10/2022,2359 Performed at [facility], [address] [place], [place] [address]   hCG, quantitative, pregnancy     Status: Abnormal   Collection Time: 06/07/22  2:45 PM  Result Value Ref Range   hCG, Beta Chain, Quant, S 4,963 (H) <5 mIU/mL    Comment:          GEST. AGE      CONC.  (mIU/mL)   <=1 WEEK        5 - 50     2 WEEKS       50 - 500     3 WEEKS       100 - 10,000     4 WEEKS  1,000 - 30,000     5 WEEKS     3,500 - 115,000   6-8 WEEKS     12,000 - 270,000    12 WEEKS     15,000 - 220,000        FEMALE AND NON-PREGNANT FEMALE:     LESS THAN 5 mIU/mL Performed at [facility], [address] [place], [place] [address]   CBC with Differential     Status: Abnormal   Collection Time: 06/07/22  2:46 PM  Result Value Ref Range   WBC 9.9 4.0 - 10.5 K/uL   RBC 4.67 3.87 - 5.11 MIL/uL   Hemoglobin 13.0 12.0 - 15.0 g/dL   HCT 47.4 25.9 - 56.3 %   MCV 83.3 80.0 - 100.0 fL   MCH 27.8 26.0 - 34.0 pg   MCHC 33.4 30.0 - 36.0 g/dL   RDW 87.5 64.3 - 32.9 %   Platelets 222 150 - 400 K/uL   nRBC 0.0 0.0 - 0.2 %   Neutrophils Relative % 87 %   Neutro Abs 8.8 (H) 1.7 - 7.7 K/uL   Lymphocytes Relative 6 %   Lymphs Abs 0.6 (L) 0.7 - 4.0 K/uL   Monocytes Relative 5 %   Monocytes Absolute 0.5 0.1 - 1.0 K/uL   Eosinophils Relative 1 %   Eosinophils Absolute 0.1 0.0 - 0.5 K/uL   Basophils Relative 0 %   Basophils Absolute 0.0 0.0 - 0.1 K/uL   Immature Granulocytes 1 %   Abs Immature Granulocytes 0.05 0.00 - 0.07  K/uL    Comment: Performed at [facility], [address] [place], [place] [address]  Comprehensive metabolic panel     Status: Abnormal   Collection Time: 06/07/22  2:46 PM  Result Value Ref Range   Sodium 132 (L) 135 - 145 mmol/L   Potassium 3.8 3.5 - 5.1 mmol/L   Chloride 103 98 - 111 mmol/L   CO2 20 (L) 22 - 32 mmol/L   Glucose, Bld 88 70 - 99 mg/dL    Comment: Glucose reference range applies only to samples taken after fasting for at least 8 hours.   BUN 7 6 - 20 mg/dL   Creatinine, Ser 1.66 0.44 - 1.00 mg/dL   Calcium 9.1 8.9 - 06.3 mg/dL   Total Protein 8.0 6.5 - 8.1 g/dL   Albumin 3.9 3.5 - 5.0 g/dL   AST 24 15 - 41 U/L   ALT 23 0 - 44 U/L   Alkaline Phosphatase 62 38 - 126 U/L   Total Bilirubin 0.5 0.3 - 1.2 mg/dL   GFR, Estimated >01 >60 mL/min    Comment: (NOTE) Calculated using the CKD-EPI Creatinine Equation (2021)    Anion gap 9 5 - 15    Comment: Performed at [facility], [address] [place], [place] [address]    No results found.  Assessment/Plan: Afebrile , normal white count. Wet prep pending, U/A -large leukocytes, urine culture pending, GC/Chlamydia cultures pending.   If wet prep comes back positive then treat as appropriate . The health department to follow up with  GC/Chlamydia & urine culture results and treat as needed. . The low abdominal pain and back pain are associated with round ligament pain of pregnancy, which can be treated with heat / ice to her back and tylenol as needed.   Dr. Logan Bores consulted on plan of care and in agreement.   Doreene Burke, CNM  06/07/2022

## 2022-06-07 NOTE — ED Triage Notes (Addendum)
Pt to ED via POV form home. Pt currently [redacted] weeks pregnant with 5th pregnancy. Pt reports lower abdominal, back pain, epigastric pain, HA. Pt reports vaginal drainage started yellow in color and has noted some bleeding. Pt reports has not noticed fetal movement today but states it could be due to being in so much pain.   Unable to obtain FHR tones in triage by this RN or NP

## 2022-06-07 NOTE — ED Notes (Signed)
RN went to go discharge pt and her HR is 130s- 120s. Provider informed.

## 2022-06-09 ENCOUNTER — Ambulatory Visit: Payer: Self-pay | Admitting: Licensed Clinical Social Worker

## 2022-06-11 LAB — URINE CULTURE

## 2022-06-18 ENCOUNTER — Ambulatory Visit: Payer: Self-pay

## 2022-06-18 ENCOUNTER — Telehealth: Payer: Self-pay | Admitting: Family Medicine

## 2022-06-18 NOTE — Telephone Encounter (Signed)
Schedule pt for her follow up for the 22nd

## 2022-06-21 ENCOUNTER — Other Ambulatory Visit: Payer: Self-pay | Admitting: *Deleted

## 2022-06-21 ENCOUNTER — Encounter: Payer: Self-pay | Admitting: *Deleted

## 2022-06-21 ENCOUNTER — Ambulatory Visit: Payer: Self-pay | Admitting: *Deleted

## 2022-06-21 ENCOUNTER — Ambulatory Visit: Payer: Self-pay | Attending: Obstetrics and Gynecology

## 2022-06-21 VITALS — BP 114/63 | HR 91

## 2022-06-21 DIAGNOSIS — O09212 Supervision of pregnancy with history of pre-term labor, second trimester: Secondary | ICD-10-CM

## 2022-06-21 DIAGNOSIS — Z3A20 20 weeks gestation of pregnancy: Secondary | ICD-10-CM

## 2022-06-21 DIAGNOSIS — E669 Obesity, unspecified: Secondary | ICD-10-CM

## 2022-06-21 DIAGNOSIS — Z6791 Unspecified blood type, Rh negative: Secondary | ICD-10-CM

## 2022-06-21 DIAGNOSIS — O99212 Obesity complicating pregnancy, second trimester: Secondary | ICD-10-CM

## 2022-06-21 DIAGNOSIS — O360120 Maternal care for anti-D [Rh] antibodies, second trimester, not applicable or unspecified: Secondary | ICD-10-CM

## 2022-06-21 DIAGNOSIS — O99211 Obesity complicating pregnancy, first trimester: Secondary | ICD-10-CM | POA: Insufficient documentation

## 2022-06-21 DIAGNOSIS — O26899 Other specified pregnancy related conditions, unspecified trimester: Secondary | ICD-10-CM | POA: Insufficient documentation

## 2022-06-21 DIAGNOSIS — O09899 Supervision of other high risk pregnancies, unspecified trimester: Secondary | ICD-10-CM

## 2022-06-21 DIAGNOSIS — O09891 Supervision of other high risk pregnancies, first trimester: Secondary | ICD-10-CM | POA: Insufficient documentation

## 2022-06-21 DIAGNOSIS — O283 Abnormal ultrasonic finding on antenatal screening of mother: Secondary | ICD-10-CM | POA: Insufficient documentation

## 2022-06-22 ENCOUNTER — Ambulatory Visit: Payer: Self-pay

## 2022-06-23 ENCOUNTER — Telehealth: Payer: Self-pay | Admitting: Family Medicine

## 2022-06-23 NOTE — Telephone Encounter (Signed)
Reschedule pt for missed appt.

## 2022-06-28 ENCOUNTER — Ambulatory Visit: Payer: Self-pay | Admitting: Advanced Practice Midwife

## 2022-06-28 ENCOUNTER — Encounter: Payer: Self-pay | Admitting: Advanced Practice Midwife

## 2022-06-28 VITALS — BP 96/55 | HR 86 | Temp 97.4°F | Wt 159.6 lb

## 2022-06-28 DIAGNOSIS — O0992 Supervision of high risk pregnancy, unspecified, second trimester: Secondary | ICD-10-CM

## 2022-06-28 DIAGNOSIS — O99211 Obesity complicating pregnancy, first trimester: Secondary | ICD-10-CM

## 2022-06-28 DIAGNOSIS — M419 Scoliosis, unspecified: Secondary | ICD-10-CM

## 2022-06-28 DIAGNOSIS — B379 Candidiasis, unspecified: Secondary | ICD-10-CM

## 2022-06-28 DIAGNOSIS — O0991 Supervision of high risk pregnancy, unspecified, first trimester: Secondary | ICD-10-CM

## 2022-06-28 DIAGNOSIS — O09213 Supervision of pregnancy with history of pre-term labor, third trimester: Secondary | ICD-10-CM

## 2022-06-28 DIAGNOSIS — F32A Depression, unspecified: Secondary | ICD-10-CM

## 2022-06-28 DIAGNOSIS — O99212 Obesity complicating pregnancy, second trimester: Secondary | ICD-10-CM

## 2022-06-28 MED ORDER — CLOTRIMAZOLE 1 % VA CREA: 1.0000 | TOPICAL_CREAM | Freq: Every day | VAGINAL | 0 refills | Status: AC
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
## 2022-06-28 NOTE — Progress Notes (Unsigned)
Call to pre-admit testing at number [phone] for anesthesia consult due to hx of scoliosis. Appt scheduled for 07/26/22 at 10am - patient is aware and directions given.   Wet mount reviewed and treatment given for yeast per SO.    Earlyne Iba, RN

## 2022-07-12 LAB — WET PREP FOR TRICH, YEAST, CLUE: Trichomonas Exam: NEGATIVE

## 2022-07-19 ENCOUNTER — Ambulatory Visit: Payer: Self-pay

## 2022-07-19 ENCOUNTER — Ambulatory Visit: Payer: Self-pay | Attending: Obstetrics

## 2022-07-20 ENCOUNTER — Other Ambulatory Visit: Payer: Self-pay

## 2022-07-26 ENCOUNTER — Other Ambulatory Visit: Payer: Self-pay

## 2022-07-26 ENCOUNTER — Ambulatory Visit: Payer: Self-pay

## 2022-07-26 ENCOUNTER — Telehealth: Payer: Self-pay

## 2022-07-26 NOTE — Telephone Encounter (Signed)
Call to patient to rescheduled appointment as no provider available today. Appointment rescheduled for this Friday at 2pm.  Patient verbalized understanding.   Al Decant, RN

## 2022-07-30 ENCOUNTER — Ambulatory Visit: Payer: Self-pay | Admitting: Advanced Practice Midwife

## 2022-07-30 ENCOUNTER — Telehealth: Payer: Self-pay

## 2022-07-30 ENCOUNTER — Ambulatory Visit: Payer: Self-pay

## 2022-07-30 VITALS — BP 103/65 | HR 96 | Temp 97.0°F | Wt 163.6 lb

## 2022-07-30 DIAGNOSIS — Z8759 Personal history of other complications of pregnancy, childbirth and the puerperium: Secondary | ICD-10-CM | POA: Insufficient documentation

## 2022-07-30 DIAGNOSIS — Z8751 Personal history of pre-term labor: Secondary | ICD-10-CM | POA: Insufficient documentation

## 2022-07-30 DIAGNOSIS — O99212 Obesity complicating pregnancy, second trimester: Secondary | ICD-10-CM

## 2022-07-30 DIAGNOSIS — O0992 Supervision of high risk pregnancy, unspecified, second trimester: Secondary | ICD-10-CM

## 2022-07-30 DIAGNOSIS — O99211 Obesity complicating pregnancy, first trimester: Secondary | ICD-10-CM

## 2022-07-30 DIAGNOSIS — O09213 Supervision of pregnancy with history of pre-term labor, third trimester: Secondary | ICD-10-CM

## 2022-07-30 DIAGNOSIS — O0991 Supervision of high risk pregnancy, unspecified, first trimester: Secondary | ICD-10-CM

## 2022-07-30 DIAGNOSIS — M419 Scoliosis, unspecified: Secondary | ICD-10-CM

## 2022-07-30 DIAGNOSIS — F32A Depression, unspecified: Secondary | ICD-10-CM

## 2022-07-30 DIAGNOSIS — U071 COVID-19: Secondary | ICD-10-CM | POA: Insufficient documentation

## 2022-07-30 MED ORDER — CLOTRIMAZOLE 1 % VA CREA: 1.0000 | TOPICAL_CREAM | Freq: Every day | VAGINAL | 0 refills | Status: DC

## 2022-07-30 NOTE — Progress Notes (Signed)
Call to [facility] pre-admission testing to reschedule missed anesthesia consult appt. Left message to call regarding above and number to call provided. Rich Number, RN
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
## 2022-07-30 NOTE — Telephone Encounter (Signed)
Client [facility] 07/26/2022 for anesthesia consult. Call to [facility] pre-admission testing to reschedule appt (after 3 pm). Left message on pre-admit testing voicemail regarding above and MHC number provided to return call. Rich Number, RN

## 2022-07-30 NOTE — Progress Notes (Signed)
[facility] Interpretor #[id number] and [phone] [facility] Spanish Interpretor Jamas Lav) utilized.  Did not attend anesthesia appointment: needs appointment after 3 pm. Left message with Pre-Op Scheduling that patient needs appointment rescheduled. Patient aware that she will receive phone call for rescheduling purposes. Patient did not attend U/S appointment on 09/18; states she was concerned about the cost. [place] Form given with instructions. Desires Flu Vaccine at the next visit due to "cold" symptoms today. Signed Behavioral Health Consent. [facility] Education officer, museum Card given. In House Lab reviewed at visit. The patient was dispensed Clotrimazole today: see wet prep results. I provided counseling today regarding the medication. We discussed the medication, the side effects and when to call clinic. Patient given the opportunity to ask questions. Questions answered."B.Weyman Croon RN

## 2022-08-02 LAB — WET PREP FOR TRICH, YEAST, CLUE: Trichomonas Exam: NEGATIVE

## 2022-08-02 NOTE — Telephone Encounter (Signed)
Call to [place] to reschedule missed anesthesia consult appt. Left message to call regarding above and number to call provided. As close at 1600, RN will attempt repeat call tomorrow prior to that time. Rich Number, RN

## 2022-08-03 NOTE — Telephone Encounter (Signed)
Call to [place] to reschedule anesthesia consult appt. Appt scheduled for 08/10/22 at 2 pm (2 pm is latest appt available any day).   Call to client with [facility] Interpreters ID # [phone] and given above appt information. Reminded of [facility] appt on 08/12/22. Rich Number, RN

## 2022-08-10 ENCOUNTER — Inpatient Hospital Stay: Admission: RE | Admit: 2022-08-10 | Payer: Self-pay | Source: Ambulatory Visit

## 2022-08-12 ENCOUNTER — Ambulatory Visit: Payer: Self-pay | Attending: Obstetrics

## 2022-08-12 ENCOUNTER — Ambulatory Visit: Payer: Self-pay

## 2022-08-12 ENCOUNTER — Telehealth: Payer: Self-pay

## 2022-08-12 NOTE — Telephone Encounter (Signed)
Call to patient to reschedule appointment as she missed [facility] RV on 08/12/22. Patient did not answer and no voicemail available. Attempted to call twice.   Patient needs 28 week labs ASAP. When rescheduling have patient get an appointment with nurse clinic for lab only (1 hr gtt, HIV, rpr, antibody screen and hgb) and revisit at [facility] RV when appointment is available.    If no nurse clinic appts available scheduled all with Maternity next appt available.   Al Decant, RN

## 2022-08-13 NOTE — Telephone Encounter (Signed)
Call to client with [facility] Interpreters ID # [phone] to reschedule missed 08/12/2022 post-partum appt. Phone not accepting calls at this time. Call to emergency contact who states he will give message to client to call clinic and appt line number provided. Rich Number, RN

## 2022-08-18 ENCOUNTER — Telehealth: Payer: Self-pay

## 2022-08-18 NOTE — Telephone Encounter (Signed)
Call to client with [facility] Interpreters ID # [phone] to reschedule missed [facility] RV appt and 28 week labs are due (not due post-partum appt as previously documented in phone encounter). Per recorded message, not available. Call to emergency contact (spouse) who states he will give her the message to call the clinic. Rich Number, RN

## 2022-08-19 NOTE — Telephone Encounter (Signed)
Call to client with [facility] Interpreters ID # [phone] and [facility] appt scheduled for 08/27/22 with arrival time of 0920. Rich Number, RN

## 2022-08-25 ENCOUNTER — Ambulatory Visit: Payer: Self-pay | Admitting: Licensed Clinical Social Worker
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
## 2022-08-27 ENCOUNTER — Ambulatory Visit: Payer: Self-pay

## 2022-09-06 ENCOUNTER — Telehealth: Payer: Self-pay

## 2022-09-06 ENCOUNTER — Ambulatory Visit: Payer: Self-pay

## 2022-09-06 NOTE — Telephone Encounter (Signed)
[facility] for [place] RV 09/06/2022. Call to client and appt rescheduled for 09/15/2022 with arrival time of 1400. Rich Number, RN

## 2022-09-15 ENCOUNTER — Telehealth: Payer: Self-pay

## 2022-09-15 ENCOUNTER — Ambulatory Visit: Payer: Self-pay | Admitting: Advanced Practice Midwife

## 2022-09-15 VITALS — BP 104/72 | HR 88 | Temp 96.8°F | Wt 161.8 lb

## 2022-09-15 DIAGNOSIS — O09899 Supervision of other high risk pregnancies, unspecified trimester: Secondary | ICD-10-CM | POA: Insufficient documentation

## 2022-09-15 DIAGNOSIS — O09893 Supervision of other high risk pregnancies, third trimester: Secondary | ICD-10-CM

## 2022-09-15 DIAGNOSIS — Z91199 Patient's noncompliance with other medical treatment and regimen due to unspecified reason: Secondary | ICD-10-CM | POA: Insufficient documentation

## 2022-09-15 DIAGNOSIS — O99211 Obesity complicating pregnancy, first trimester: Secondary | ICD-10-CM

## 2022-09-15 DIAGNOSIS — O99213 Obesity complicating pregnancy, third trimester: Secondary | ICD-10-CM

## 2022-09-15 DIAGNOSIS — Z8751 Personal history of pre-term labor: Secondary | ICD-10-CM

## 2022-09-15 DIAGNOSIS — O0993 Supervision of high risk pregnancy, unspecified, third trimester: Secondary | ICD-10-CM

## 2022-09-15 DIAGNOSIS — O0991 Supervision of high risk pregnancy, unspecified, first trimester: Secondary | ICD-10-CM

## 2022-09-15 DIAGNOSIS — F32A Depression, unspecified: Secondary | ICD-10-CM

## 2022-09-15 DIAGNOSIS — M419 Scoliosis, unspecified: Secondary | ICD-10-CM

## 2022-09-15 LAB — HEMOGLOBIN, FINGERSTICK: Hemoglobin: 11.7 g/dL (ref 11.1–15.9)

## 2022-09-15 NOTE — Progress Notes (Signed)
States day before yesterday in a.m. thick yellow mucous but has not recurred since with swelling hands and feet with "contractions".  States "contractions" since then but irregular.  Patient received Rhogam 09/01/2022 during hospital visit (see scanned form).  TDAp given with VIS. Glucola finished at 1521 and tolerated well. BTL signed and faxed to [facility] (Patient desires to change care to [facility]). [facility] Financial Assistance Form given to patient.  In House Lab results (Hgb) reviewed during visit. BThiele RN

## 2022-09-15 NOTE — Telephone Encounter (Signed)
Per [facility] MFM [facility], client presented there at 1400 and they are unsure why. Client has no scheduled [place] appt, but does have [facility] RV appt today at 1400. Call to client with [facility] Interpreters ID# [phone] and counseled appt today was with [facility]. Client counseled we would work her in this pm [facility] 09/06/2322) and she agrees to appt. Jossie Ng, RN
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
## 2022-09-16 ENCOUNTER — Telehealth: Payer: Self-pay

## 2022-09-16 LAB — RPR: RPR Ser Ql: NONREACTIVE

## 2022-09-16 LAB — GLUCOSE TOLERANCE, 1 HOUR: Glucose, 1Hr PP: 171 mg/dL (ref 70–199)

## 2022-09-16 LAB — HIV-1/HIV-2 QUALITATIVE RNA
HIV-1 RNA, Qualitative: NONREACTIVE
HIV-2 RNA, Qualitative: NONREACTIVE

## 2022-09-16 NOTE — Telephone Encounter (Signed)
Call to patient to notify her of elevated 1 hr gtt results.   Instructions given for patient to arrive on 10/22/22 for 0800 arrival time. Instructions given to fast (no food or drink other than sips of water) from midnight before at least 8 hours before appointment.   Patient stated understanding.   Earlyne Iba, RN

## 2022-09-22 ENCOUNTER — Telehealth: Payer: Self-pay

## 2022-09-22 ENCOUNTER — Ambulatory Visit: Payer: Self-pay | Admitting: Nurse Practitioner

## 2022-09-22 ENCOUNTER — Other Ambulatory Visit: Payer: Self-pay

## 2022-09-22 VITALS — BP 98/63 | HR 92 | Temp 96.9°F | Wt 163.6 lb

## 2022-09-22 DIAGNOSIS — O99211 Obesity complicating pregnancy, first trimester: Secondary | ICD-10-CM

## 2022-09-22 DIAGNOSIS — F32A Depression, unspecified: Secondary | ICD-10-CM

## 2022-09-22 DIAGNOSIS — O0993 Supervision of high risk pregnancy, unspecified, third trimester: Secondary | ICD-10-CM

## 2022-09-22 DIAGNOSIS — O99213 Obesity complicating pregnancy, third trimester: Secondary | ICD-10-CM

## 2022-09-22 LAB — URINALYSIS
Bilirubin, UA: NEGATIVE
Glucose, UA: NEGATIVE
Nitrite, UA: NEGATIVE
RBC, UA: NEGATIVE
Specific Gravity, UA: 1.02 (ref 1.005–1.030)
Urobilinogen, Ur: 2 mg/dL — ABNORMAL HIGH (ref 0.2–1.0)
pH, UA: 7 (ref 5.0–7.5)

## 2022-09-22 NOTE — Telephone Encounter (Signed)
Cala Bradford from lab called maternity clinic stating patient was not there for 11:20 draw.   RN looked at for her at waiting room, intercom called her and called her x2 with no answer. Patient verbalized to RN during rooming-in that she knew the times to arrive at lab and she knows she needed 2 more draws.   RN called patient emergency contact - her husband. He states she does know where she is he is getting off from work but is going to message her to call us.   RN called patient a 3rd time and patient's phone call went straight disconnected.   Need to call patient to reschedule and restart 3 hr gtt.   Earlyne Iba, RN

## 2022-09-22 NOTE — Progress Notes (Signed)
3 HR GTT today. Patient came late today because was "...not feeling well". Confirmed that she did not eat or drinks since 8 p.m. Patient c/o swelling and sweating of hands and feet x 3 days.  She states the day of her U/S (11/20) she was sweaty and dizzy with changes in vision on day that required the U/S tech placing her on her left side to tolerate the procedure. Also states she feels "uncomfortable at night" so not sleeping well. Earlyne Iba RN Engineer, structural.

## 2022-09-22 NOTE — Telephone Encounter (Signed)
Patient called front desk stating she was "not feeling well and only slept a few hours last night". Also stated she has "swelling of hands and feet". Patient requested a 0900 appointment today for  3 HR GTT. Changed appointment to OB Problem visit at 0900. BThiele RN
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
## 2022-09-22 NOTE — Progress Notes (Signed)
During clinic visit patient stated she was stepping out of the clinic for "a sip of water". Patient did not return to clinic. Provider notified. BThiele RN

## 2022-09-23 LAB — GLUCOSE, FASTING: Glucose, Plasma: 72 mg/dL (ref 70–99)

## 2022-09-28 NOTE — Telephone Encounter (Signed)
Client has [facility] RV appt scheduled for tomorrow pm. Will reschedule 3 hour GTT at that time. Jossie Ng, RN

## 2022-09-29 ENCOUNTER — Ambulatory Visit: Payer: Self-pay | Admitting: Advanced Practice Midwife

## 2022-09-29 VITALS — BP 120/68 | HR 112 | Temp 97.0°F | Wt 165.4 lb

## 2022-09-29 DIAGNOSIS — O09213 Supervision of pregnancy with history of pre-term labor, third trimester: Secondary | ICD-10-CM

## 2022-09-29 DIAGNOSIS — O09899 Supervision of other high risk pregnancies, unspecified trimester: Secondary | ICD-10-CM

## 2022-09-29 DIAGNOSIS — O0993 Supervision of high risk pregnancy, unspecified, third trimester: Secondary | ICD-10-CM

## 2022-09-29 DIAGNOSIS — O0991 Supervision of high risk pregnancy, unspecified, first trimester: Secondary | ICD-10-CM

## 2022-09-29 DIAGNOSIS — O99211 Obesity complicating pregnancy, first trimester: Secondary | ICD-10-CM

## 2022-09-29 DIAGNOSIS — Z8759 Personal history of other complications of pregnancy, childbirth and the puerperium: Secondary | ICD-10-CM

## 2022-09-29 DIAGNOSIS — Z8751 Personal history of pre-term labor: Secondary | ICD-10-CM

## 2022-09-29 DIAGNOSIS — Z91199 Patient's noncompliance with other medical treatment and regimen due to unspecified reason: Secondary | ICD-10-CM

## 2022-09-29 DIAGNOSIS — O09893 Supervision of other high risk pregnancies, third trimester: Secondary | ICD-10-CM

## 2022-09-29 DIAGNOSIS — O99213 Obesity complicating pregnancy, third trimester: Secondary | ICD-10-CM

## 2022-09-29 LAB — URINALYSIS
Bilirubin, UA: NEGATIVE
Glucose, UA: NEGATIVE
Nitrite, UA: NEGATIVE
RBC, UA: NEGATIVE
Specific Gravity, UA: 1.025 (ref 1.005–1.030)
Urobilinogen, Ur: 4 mg/dL — ABNORMAL HIGH (ref 0.2–1.0)
pH, UA: 6 (ref 5.0–7.5)

## 2022-09-29 NOTE — Progress Notes (Signed)
[employer] [phone] utilized. Pediatrician undecided. Peds List given to patient and instructed to call pediatricians' offices this week. States that she is performing kick counts and baby moving well. Scheduled 3 HR GTT for this Friday morning. C/o swelling in hands and feet. Occasional cramping. In House Lab reviewed at visit. B.Imanol Bihl RN
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
## 2022-10-01 ENCOUNTER — Ambulatory Visit: Payer: Self-pay | Admitting: Advanced Practice Midwife

## 2022-10-01 ENCOUNTER — Telehealth: Payer: Self-pay

## 2022-10-01 DIAGNOSIS — R7309 Other abnormal glucose: Secondary | ICD-10-CM

## 2022-10-01 DIAGNOSIS — O09213 Supervision of pregnancy with history of pre-term labor, third trimester: Secondary | ICD-10-CM

## 2022-10-01 LAB — URINE CULTURE

## 2022-10-01 NOTE — Telephone Encounter (Signed)
Patient returned phone call and given appointment information. Patient counseled to arrive at [facility] on 10/08/22 at 1:30 so that she can find her way in [facility] to the 4th floor L&D by 2:00pm, where she can check in. Patient states understanding. Interpreter, M. [place].Burt Knack, RN

## 2022-10-01 NOTE — Telephone Encounter (Signed)
TC to patient to inform of [facility] anesthesia consult appointment on 10/08/2022 at 2:00pm in L&D at [facility]. No answer on patient phone and no option to LM. TC to patient partner and he agrees to tell patient to call back asap today. Interpreter, M. Yemen.Burt Knack, RN

## 2022-10-01 NOTE — Progress Notes (Signed)
Patient here for 3 hr gtt lab. Patient states she last ate and drinked last night at 8:30pm last night. Order placed for 3 hr gtt and sent to lab.   Informed patient to let us know if she vomits or has any issues. Patient verbalized understanding.   Earlyne Iba, RN

## 2022-10-02 LAB — GESTATIONAL GLUCOSE TOLERANCE
Glucose, Fasting: 75 mg/dL (ref 70–94)
Glucose, GTT - 1 Hour: 129 mg/dL (ref 70–179)
Glucose, GTT - 2 Hour: 107 mg/dL (ref 70–154)
Glucose, GTT - 3 Hour: 99 mg/dL (ref 70–139)

## 2022-10-04 ENCOUNTER — Inpatient Hospital Stay
Admission: EM | Admit: 2022-10-04 | Discharge: 2022-10-06 | DRG: 833 | Disposition: A | Discharge status: Home / Self Care | Payer: Self-pay | Attending: Obstetrics | Admitting: Obstetrics

## 2022-10-04 ENCOUNTER — Encounter: Payer: Self-pay | Admitting: Family Medicine

## 2022-10-04 ENCOUNTER — Encounter: Payer: Self-pay | Admitting: Obstetrics and Gynecology

## 2022-10-04 DIAGNOSIS — Z6791 Unspecified blood type, Rh negative: Secondary | ICD-10-CM

## 2022-10-04 DIAGNOSIS — Z8616 Personal history of COVID-19: Secondary | ICD-10-CM

## 2022-10-04 DIAGNOSIS — Z3A35 35 weeks gestation of pregnancy: Secondary | ICD-10-CM

## 2022-10-04 DIAGNOSIS — O99820 Streptococcus B carrier state complicating pregnancy: Secondary | ICD-10-CM | POA: Diagnosis present

## 2022-10-04 DIAGNOSIS — O99810 Abnormal glucose complicating pregnancy: Secondary | ICD-10-CM | POA: Insufficient documentation

## 2022-10-04 DIAGNOSIS — O26893 Other specified pregnancy related conditions, third trimester: Secondary | ICD-10-CM | POA: Diagnosis present

## 2022-10-04 DIAGNOSIS — O4703 False labor before 37 completed weeks of gestation, third trimester: Secondary | ICD-10-CM | POA: Diagnosis present

## 2022-10-04 DIAGNOSIS — O99213 Obesity complicating pregnancy, third trimester: Secondary | ICD-10-CM | POA: Diagnosis present

## 2022-10-04 LAB — RAPID HIV SCREEN (HIV 1/2 AB+AG)
HIV 1/2 Antibodies: NONREACTIVE
HIV-1 P24 Antigen - HIV24: NONREACTIVE

## 2022-10-04 LAB — TYPE AND SCREEN
ABO/RH(D): O NEG
Antibody Screen: POSITIVE

## 2022-10-04 LAB — CBC
HCT: 33.5 % — ABNORMAL LOW (ref 36.0–46.0)
Hemoglobin: 11.4 g/dL — ABNORMAL LOW (ref 12.0–15.0)
MCH: 27.3 pg (ref 26.0–34.0)
MCHC: 34 g/dL (ref 30.0–36.0)
MCV: 80.3 fL (ref 80.0–100.0)
Platelets: 230 10*3/uL (ref 150–400)
RBC: 4.17 MIL/uL (ref 3.87–5.11)
RDW: 12.8 % (ref 11.5–15.5)
WBC: 11.8 10*3/uL — ABNORMAL HIGH (ref 4.0–10.5)
nRBC: 0 % (ref 0.0–0.2)

## 2022-10-04 LAB — WET PREP, GENITAL
Clue Cells Wet Prep HPF POC: NONE SEEN
Sperm: NONE SEEN
Trich, Wet Prep: NONE SEEN
WBC, Wet Prep HPF POC: >=10 — AB (ref <10)

## 2022-10-04 LAB — GROUP B STREP BY PCR: Group B strep by PCR: POSITIVE — AB

## 2022-10-04 MED ORDER — LACTATED RINGERS IV SOLN: 500.0000 mL | INTRAVENOUS | Status: DC | PRN

## 2022-10-04 MED ORDER — OXYTOCIN BOLUS FROM INFUSION: 333.0000 mL | Freq: Once | INTRAVENOUS | Status: DC

## 2022-10-04 MED ORDER — SODIUM CHLORIDE 0.9% FLUSH: 3.0000 mL | Freq: Two times a day (BID) | INTRAVENOUS | Status: DC

## 2022-10-04 MED ORDER — OXYTOCIN-SODIUM CHLORIDE 30-0.9 UT/500ML-% IV SOLN: 2.5000 [IU]/h | INTRAVENOUS | Status: DC

## 2022-10-04 MED ORDER — SODIUM CHLORIDE 0.9% FLUSH: 3.0000 mL | INTRAVENOUS | Status: DC | PRN

## 2022-10-04 MED ORDER — FENTANYL CITRATE (PF) 100 MCG/2ML IJ SOLN: 50.0000 ug | INTRAMUSCULAR | Status: DC | PRN

## 2022-10-04 MED ORDER — LIDOCAINE HCL (PF) 1 % IJ SOLN: 30.0000 mL | INTRAMUSCULAR | Status: DC | PRN

## 2022-10-04 MED ORDER — ONDANSETRON HCL 4 MG/2ML IJ SOLN
4.0000 mg | Freq: Four times a day (QID) | INTRAMUSCULAR | Status: DC | PRN
  Administered 2022-10-04 (×2): 4 mg via INTRAVENOUS
  Filled 2022-10-04 (×2): qty 2

## 2022-10-04 MED ORDER — SODIUM CHLORIDE 0.9 % IV SOLN: 250.0000 mL | INTRAVENOUS | Status: DC | PRN

## 2022-10-04 MED ORDER — PENICILLIN G POT IN DEXTROSE 60000 UNIT/ML IV SOLN
3.0000 10*6.[IU] | INTRAVENOUS | Status: DC
  Administered 2022-10-04 – 2022-10-05 (×6): 3 10*6.[IU] via INTRAVENOUS
  Filled 2022-10-04 (×6): qty 50

## 2022-10-04 MED ORDER — SODIUM CHLORIDE 0.9 % IV SOLN
5.0000 10*6.[IU] | Freq: Once | INTRAVENOUS | Status: AC
  Administered 2022-10-04: 5 10*6.[IU] via INTRAVENOUS
  Filled 2022-10-04: qty 5

## 2022-10-04 MED ORDER — SOD CITRATE-CITRIC ACID 500-334 MG/5ML PO SOLN: 30.0000 mL | ORAL | Status: DC | PRN

## 2022-10-04 MED ORDER — BETAMETHASONE SOD PHOS & ACET 6 (3-3) MG/ML IJ SUSP
12.0000 mg | INTRAMUSCULAR | Status: AC
  Administered 2022-10-04 – 2022-10-05 (×2): 12 mg via INTRAMUSCULAR
  Filled 2022-10-04: qty 5

## 2022-10-04 MED ORDER — ACETAMINOPHEN 500 MG PO TABS: 1000.0000 mg | ORAL_TABLET | Freq: Four times a day (QID) | ORAL | Status: DC | PRN

## 2022-10-04 MED ORDER — LACTATED RINGERS IV SOLN: INTRAVENOUS | Status: DC

## 2022-10-04 NOTE — OB Triage Note (Signed)
Pt arrived to unit wheeled by ED saff with complaints of contractions that have increased over the last day. Pt is seen at [facility] and is 110w1d G7P4. Patient placed on EFM and TOCO to non tender area of abdomen. Pt reports no symptoms consistent with active vaginal bleeding or ROM. Pt history reviewed, vitals taken, and oriented to environment with help of [employer] spanish interpreter Byrd Hesselbach [phone]. Patient reports she is currently being treated for vaginal yeast infection and BV. Patient reports she has been treated several times, however the infections return post treatment. Patient reports having intercourse in the last few hours. Will notify provider of patient's arrival.

## 2022-10-04 NOTE — Progress Notes (Signed)
L&D Note    Subjective:  Starting to feel more vaginal pressure and contractions feel more intense.  Coping and breathing well with contractions.   Objective:   Vitals:   10/04/22 0713 10/04/22 1151 10/04/22 1623 10/04/22 1948  BP: (!) 98/59 106/63 125/67 109/70  Pulse: 91 93 (!) 131 (!) 106  Resp:  16 14 16   Temp: 97.7 F (36.5 C) 98.2 F (36.8 C) 97.9 F (36.6 C) 97.6 F (36.4 C)  TempSrc: Oral Oral Oral Oral  Weight:  75 kg    Height:  5' (1.524 m)      Current Vital Signs 24h Vital Sign Ranges  T 97.6 F (36.4 C) Temp  Avg: 97.9 F (36.6 C)  Min: 97.6 F (36.4 C)  Max: 98.2 F (36.8 C)  BP 109/70 BP  Min: 98/59  Max: 125/67  HR (!) 106 Pulse  Avg: 102.2  Min: 90  Max: 131  RR 16 Resp  Avg: 15.3  Min: 14  Max: 16  SaO2     No data recorded      Gen: alert, cooperative, no distress FHR: Baseline: 150 bpm, Variability: moderate, Accels: Present, Decels: none Toco: regular, every 2-8 minutes SVE: Dilation: 3.5 (Outer OS 4-5 cm) Effacement (%): 50 Cervical Position: Middle Station: -2 Presentation: Vertex Exam by:: [email or web address] CNM  Medications SCHEDULED MEDICATIONS   betamethasone acetate-betamethasone sodium phosphate  12 mg Intramuscular Q24 Hr x 2   oxytocin 40 units in LR 1000 mL  333 mL Intravenous Once   sodium chloride flush  3 mL Intravenous Q12H    MEDICATION INFUSIONS   sodium chloride     lactated ringers     lactated ringers Stopped (10/04/22 1256)   oxytocin     pencillin G potassium IV 3 Million Units (10/04/22 1821)    PRN MEDICATIONS  sodium chloride, acetaminophen, fentaNYL (SUBLIMAZE) injection, lactated ringers, lidocaine (PF), ondansetron, sodium chloride flush, sodium citrate-citric acid   Assessment & Plan:  31 y.o. 38 at [redacted]w[redacted]d admitted for preterm labor  -Labor: Early latent preterm labor.  Cervix has gradually changed since observation this morning.  Discussed that this is likely early preterm labor.  Will continue with expectant  management.   -Fetal Well-being: Category I -GBS: positive - PCN x 3 doses  -Analgesia: position changes  and unmedicated labor support options   [redacted]w[redacted]d, CNM  10/04/2022 8:22 PM  14/01/2022 OB/GYN
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
## 2022-10-05 ENCOUNTER — Encounter
Admission: EM | Disposition: A | Discharge status: Home / Self Care | Payer: Self-pay | Source: Home / Self Care | Attending: Obstetrics

## 2022-10-05 ENCOUNTER — Other Ambulatory Visit: Payer: Self-pay

## 2022-10-05 ENCOUNTER — Encounter: Payer: Self-pay | Admitting: Obstetrics and Gynecology

## 2022-10-05 ENCOUNTER — Encounter: Payer: Self-pay | Admitting: Certified Registered Nurse Anesthetist

## 2022-10-05 LAB — RPR: RPR Ser Ql: NONREACTIVE

## 2022-10-05 SURGERY — Surgical Case

## 2022-10-05 MED ORDER — PHENYLEPHRINE HCL-NACL 20-0.9 MG/250ML-% IV SOLN
INTRAVENOUS | Status: AC
  Filled 2022-10-05: qty 250

## 2022-10-05 SURGICAL SUPPLY — 27 items
BARRIER ADHS 3X4 INTERCEED (GAUZE/BANDAGES/DRESSINGS) ×1 IMPLANT
CHLORAPREP W/TINT 26 (MISCELLANEOUS) ×1 IMPLANT
DRSG TELFA 3X8 NADH STRL (GAUZE/BANDAGES/DRESSINGS) ×1 IMPLANT
ELECT CAUTERY BLADE 6.4 (BLADE) ×1 IMPLANT
ELECT REM PT RETURN 9FT ADLT (ELECTROSURGICAL) ×1
ELECTRODE REM PT RTRN 9FT ADLT (ELECTROSURGICAL) ×1 IMPLANT
GAUZE SPONGE 4X4 12PLY STRL (GAUZE/BANDAGES/DRESSINGS) ×1 IMPLANT
GLOVE SURG SYN 8.0 (GLOVE) ×1 IMPLANT
GOWN STRL REUS W/ TWL LRG LVL3 (GOWN DISPOSABLE) ×2 IMPLANT
GOWN STRL REUS W/ TWL XL LVL3 (GOWN DISPOSABLE) ×1 IMPLANT
GOWN STRL REUS W/TWL LRG LVL3 (GOWN DISPOSABLE) ×2
GOWN STRL REUS W/TWL XL LVL3 (GOWN DISPOSABLE) ×1
MANIFOLD NEPTUNE II (INSTRUMENTS) ×1 IMPLANT
MAT PREVALON FULL STRYKER (MISCELLANEOUS) ×1 IMPLANT
NEEDLE HYPO 22GX1.5 SAFETY (NEEDLE) ×1 IMPLANT
NS IRRIG 1000ML POUR BTL (IV SOLUTION) ×1 IMPLANT
PACK C SECTION AR (MISCELLANEOUS) ×1 IMPLANT
PAD OB MATERNITY 4.3X12.25 (PERSONAL CARE ITEMS) ×1 IMPLANT
PAD PREP 24X41 OB/GYN DISP (PERSONAL CARE ITEMS) ×1 IMPLANT
SCRUB CHG 4% DYNA-HEX 4OZ (MISCELLANEOUS) ×1 IMPLANT
STRAP SAFETY 5IN WIDE (MISCELLANEOUS) ×1 IMPLANT
SUT CHROMIC 1 CTX 36 (SUTURE) ×3 IMPLANT
SUT PLAIN GUT 0 (SUTURE) ×2 IMPLANT
SUT VIC AB 0 CT1 36 (SUTURE) ×2 IMPLANT
SYR 30ML LL (SYRINGE) ×2 IMPLANT
TRAP FLUID SMOKE EVACUATOR (MISCELLANEOUS) ×1 IMPLANT
WATER STERILE IRR 500ML POUR (IV SOLUTION) ×1 IMPLANT

## 2022-10-05 NOTE — Progress Notes (Signed)
Labor Check  Subj:  Complaints: }has no unusual complaints  Obj:  BP 101/60   Pulse 94   Temp 97.9 F (36.6 C) (Oral)   Resp 15   Ht 5' (1.524 m)   Wt 75 kg   LMP 01/23/2022 (Exact Date)   BMI 32.30 kg/m     Cervix: Dilation: 3.5 (Outer OS 4-5 cm) / Effacement (%): 50 / Station: -2  Baseline LNL:GXQJJHER: 150 bpm, Variability: Good {> 6 bpm), Accelerations: Reactive, and Decelerations: Absent Contractions: irregular, every 24 minutes Overall assessment: reassuring    A/P: 31 y.o. [id number] female at [redacted]w[redacted]d with None  1.  Labor: early preterm labor Labor expectant management 2.  YJE:HUDJSHF assessment: Category I 3.  Group B Strep positive 4. Membranes intact 5.  Pain: level of pain (1-10, 10 severe), 5 6.  Recheck:Not evaluated. 7. Questionable labor, re-evaluate in 2 hours., Expectant management., and Intervention: expectant management and BMZ 2nd dose due at 1030  Chari Manning [facility] 10/05/2022 8:42 AM

## 2022-10-05 NOTE — Progress Notes (Signed)
Labor Check  Subj:  Complaints: }has no unusual complaints  Obj:  BP 101/60   Pulse 94   Temp 97.9 F (36.6 C) (Oral)   Resp 15   Ht 5' (1.524 m)   Wt 75 kg   LMP 01/23/2022 (Exact Date)   BMI 32.30 kg/m     Cervix: Dilation: 3.5 (4-5 external os) / Effacement (%): 50 / Station: -2  Baseline MBE:MLJQGBEE: 135 bpm, Variability: Good {> 6 bpm), Accelerations: Reactive, and Decelerations: Absent Contractions: irregular, every 2-8 minutes Overall assessment: reassuring    A/P: 31 y.o. [id number] female at [redacted]w[redacted]d with None  1.  Labor: Early latent labor. Labor early preterm labor 2.  FXJ:OITGPQD assessment: Category I 3.  Group B Strep positive 4. Membranes intact 5.  Pain: level of pain (1-10, 10 severe), 6 with pressure 6.  Recheck:Evaluated by digital exam.outer os 4.5, inner os 3.5/50/-2 7. Expectant management.  Chari Manning [facility] 10/05/2022 10:54 AM

## 2022-10-06 MED ORDER — ACETAMINOPHEN 500 MG PO TABS: 1000.0000 mg | ORAL_TABLET | Freq: Four times a day (QID) | ORAL | 0 refills | Status: DC | PRN

## 2022-10-06 NOTE — Consult Note (Signed)
Anesthesia reviewed and interviewed patient today regarding her upcoming delivery and desire for an epidural. She has a history of mild scoliosis that does not causes neurologic issues or cardiopulmonary problems. She has had two successful epidurals in the past. The risks of epidurals in light of scoliosis were discussed. Pt is aware and we will proceed to place an epidural as desired during delivery.   Nelta Numbers MD Anesthesia

## 2022-10-06 NOTE — Progress Notes (Signed)
Anesthesia (Dr. Laural Benes) at bedside to evaluate patient for epidural when she returns for delivery

## 2022-10-06 NOTE — Progress Notes (Signed)
Pt given discharged instructions including follow up care, labor precautions and reasons to return. Pt and husband verbalized understanding and had no questions.

## 2022-10-08 ENCOUNTER — Telehealth: Payer: Self-pay | Admitting: Family Medicine

## 2022-10-08 NOTE — Telephone Encounter (Signed)
Pt called, she was at the hospital couple of days ago. Was sent home because she did not dilated enough but not feeling well since yesterday and having discharge. Please call asap

## 2022-10-08 NOTE — Telephone Encounter (Signed)
Returned patient Barista.  Patent states has not felt well last 2 days-contractions and unable to rest. Since this morning c/o "contractions" 5-6 or greater every hour with "blood tinged" vaginal secretions. Does not change with fluids or position change. Patient  instructed to go to delivery hospital [facility]) for rule out labor. Patient states she may go to [employer] BThiele RN

## 2022-10-10 ENCOUNTER — Other Ambulatory Visit: Payer: Self-pay

## 2022-10-10 ENCOUNTER — Encounter: Payer: Self-pay | Admitting: Obstetrics and Gynecology

## 2022-10-10 ENCOUNTER — Observation Stay
Admission: EM | Admit: 2022-10-10 | Discharge: 2022-10-10 | Disposition: A | Discharge status: Home / Self Care | Payer: Self-pay | Attending: Obstetrics and Gynecology | Admitting: Obstetrics and Gynecology

## 2022-10-10 DIAGNOSIS — Z3A36 36 weeks gestation of pregnancy: Secondary | ICD-10-CM | POA: Insufficient documentation

## 2022-10-10 DIAGNOSIS — O4703 False labor before 37 completed weeks of gestation, third trimester: Principal | ICD-10-CM | POA: Diagnosis present

## 2022-10-10 LAB — TYPE AND SCREEN
ABO/RH(D): O NEG
Antibody Screen: POSITIVE

## 2022-10-10 LAB — CBC
HCT: 32.6 % — ABNORMAL LOW (ref 36.0–46.0)
Hemoglobin: 11.1 g/dL — ABNORMAL LOW (ref 12.0–15.0)
MCH: 27.7 pg (ref 26.0–34.0)
MCHC: 34 g/dL (ref 30.0–36.0)
MCV: 81.3 fL (ref 80.0–100.0)
Platelets: 233 10*3/uL (ref 150–400)
RBC: 4.01 MIL/uL (ref 3.87–5.11)
RDW: 12.6 % (ref 11.5–15.5)
WBC: 14.1 10*3/uL — ABNORMAL HIGH (ref 4.0–10.5)
nRBC: 0 % (ref 0.0–0.2)

## 2022-10-10 LAB — CHLAMYDIA/NGC RT PCR (ARMC ONLY)
Chlamydia Tr: NOT DETECTED
N gonorrhoeae: NOT DETECTED

## 2022-10-10 MED ORDER — OXYTOCIN BOLUS FROM INFUSION: 333.0000 mL | Freq: Once | INTRAVENOUS | Status: DC

## 2022-10-10 MED ORDER — OXYTOCIN-SODIUM CHLORIDE 30-0.9 UT/500ML-% IV SOLN: 2.5000 [IU]/h | INTRAVENOUS | Status: DC

## 2022-10-10 MED ORDER — ACETAMINOPHEN 325 MG PO TABS: 650.0000 mg | ORAL_TABLET | ORAL | Status: DC | PRN

## 2022-10-10 MED ORDER — ZOLPIDEM TARTRATE 5 MG PO TABS
5.0000 mg | ORAL_TABLET | Freq: Once | ORAL | Status: AC
  Administered 2022-10-10: 5 mg via ORAL
  Filled 2022-10-10: qty 1

## 2022-10-10 MED ORDER — LACTATED RINGERS IV SOLN: 500.0000 mL | INTRAVENOUS | Status: DC | PRN

## 2022-10-10 MED ORDER — PENICILLIN G POT IN DEXTROSE 60000 UNIT/ML IV SOLN: 3.0000 10*6.[IU] | INTRAVENOUS | Status: DC

## 2022-10-10 MED ORDER — SOD CITRATE-CITRIC ACID 500-334 MG/5ML PO SOLN: 30.0000 mL | ORAL | Status: DC | PRN

## 2022-10-10 MED ORDER — SODIUM CHLORIDE 0.9 % IV SOLN
5.0000 10*6.[IU] | Freq: Once | INTRAVENOUS | Status: AC
  Administered 2022-10-10: 5 10*6.[IU] via INTRAVENOUS
  Filled 2022-10-10: qty 5

## 2022-10-10 MED ORDER — LACTATED RINGERS IV SOLN: INTRAVENOUS | Status: DC

## 2022-10-10 MED ORDER — ONDANSETRON HCL 4 MG/2ML IJ SOLN: 4.0000 mg | Freq: Four times a day (QID) | INTRAMUSCULAR | Status: DC | PRN

## 2022-10-10 MED ORDER — LIDOCAINE HCL (PF) 1 % IJ SOLN: 30.0000 mL | INTRAMUSCULAR | Status: DC | PRN

## 2022-10-10 NOTE — OB Triage Note (Signed)
Patient is a  G7P4 and [redacted]w[redacted]d. Patient is [facility] seen from the [facility] department but has been seen by [facility] as well. Patient c/o CTX today since around 4-5pm today. Patient stated she had some red/ spotting and fluid leaking yesterday  but none today. Denies any vomiting but is nauseas with ctx.
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
## 2022-10-11 ENCOUNTER — Other Ambulatory Visit: Payer: Self-pay

## 2022-10-11 LAB — RPR: RPR Ser Ql: NONREACTIVE

## 2022-10-15 ENCOUNTER — Ambulatory Visit: Payer: Self-pay | Admitting: Family Medicine

## 2022-10-15 ENCOUNTER — Encounter: Payer: Self-pay | Admitting: Family Medicine

## 2022-10-15 VITALS — BP 113/67 | HR 127 | Temp 97.6°F | Wt 166.2 lb

## 2022-10-15 DIAGNOSIS — O0991 Supervision of high risk pregnancy, unspecified, first trimester: Secondary | ICD-10-CM

## 2022-10-15 DIAGNOSIS — O0993 Supervision of high risk pregnancy, unspecified, third trimester: Secondary | ICD-10-CM

## 2022-10-15 DIAGNOSIS — O99213 Obesity complicating pregnancy, third trimester: Secondary | ICD-10-CM
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
## 2022-10-18 ENCOUNTER — Encounter: Payer: Self-pay | Admitting: Obstetrics and Gynecology

## 2022-10-18 ENCOUNTER — Other Ambulatory Visit: Payer: Self-pay

## 2022-10-18 ENCOUNTER — Inpatient Hospital Stay: Payer: Medicaid Other | Admitting: Anesthesiology

## 2022-10-18 ENCOUNTER — Inpatient Hospital Stay
Admission: EM | Admit: 2022-10-18 | Discharge: 2022-10-19 | DRG: 807 | Disposition: A | Discharge status: Home / Self Care | Payer: Medicaid Other | Attending: Obstetrics | Admitting: Obstetrics

## 2022-10-18 DIAGNOSIS — Z3A37 37 weeks gestation of pregnancy: Secondary | ICD-10-CM

## 2022-10-18 DIAGNOSIS — O99810 Abnormal glucose complicating pregnancy: Secondary | ICD-10-CM

## 2022-10-18 DIAGNOSIS — O26899 Other specified pregnancy related conditions, unspecified trimester: Secondary | ICD-10-CM

## 2022-10-18 DIAGNOSIS — Z6791 Unspecified blood type, Rh negative: Secondary | ICD-10-CM | POA: Diagnosis not present

## 2022-10-18 DIAGNOSIS — O26893 Other specified pregnancy related conditions, third trimester: Secondary | ICD-10-CM | POA: Diagnosis present

## 2022-10-18 DIAGNOSIS — Z8616 Personal history of COVID-19: Secondary | ICD-10-CM | POA: Diagnosis not present

## 2022-10-18 DIAGNOSIS — O99824 Streptococcus B carrier state complicating childbirth: Secondary | ICD-10-CM | POA: Diagnosis present

## 2022-10-18 DIAGNOSIS — O4292 Full-term premature rupture of membranes, unspecified as to length of time between rupture and onset of labor: Secondary | ICD-10-CM | POA: Diagnosis present

## 2022-10-18 DIAGNOSIS — Z7982 Long term (current) use of aspirin: Secondary | ICD-10-CM | POA: Diagnosis not present

## 2022-10-18 DIAGNOSIS — O99214 Obesity complicating childbirth: Secondary | ICD-10-CM | POA: Diagnosis present

## 2022-10-18 DIAGNOSIS — O0993 Supervision of high risk pregnancy, unspecified, third trimester: Principal | ICD-10-CM

## 2022-10-18 LAB — CBC
HCT: 34.5 % — ABNORMAL LOW (ref 36.0–46.0)
Hemoglobin: 11.6 g/dL — ABNORMAL LOW (ref 12.0–15.0)
MCH: 27.6 pg (ref 26.0–34.0)
MCHC: 33.6 g/dL (ref 30.0–36.0)
MCV: 82.1 fL (ref 80.0–100.0)
Platelets: 229 10*3/uL (ref 150–400)
RBC: 4.2 MIL/uL (ref 3.87–5.11)
RDW: 13 % (ref 11.5–15.5)
WBC: 14.7 10*3/uL — ABNORMAL HIGH (ref 4.0–10.5)
nRBC: 0 % (ref 0.0–0.2)

## 2022-10-18 LAB — RUPTURE OF MEMBRANE (ROM)PLUS: Rom Plus: POSITIVE

## 2022-10-18 LAB — TYPE AND SCREEN
ABO/RH(D): O NEG
Antibody Screen: POSITIVE

## 2022-10-18 LAB — RPR: RPR Ser Ql: NONREACTIVE

## 2022-10-18 MED ORDER — PRENATAL MULTIVITAMIN CH: 1.0000 | ORAL_TABLET | Freq: Every day | ORAL | Status: DC

## 2022-10-18 MED ORDER — EPHEDRINE 5 MG/ML INJ: 10.0000 mg | INTRAVENOUS | Status: DC | PRN

## 2022-10-18 MED ORDER — WITCH HAZEL-GLYCERIN EX PADS: 1.0000 | MEDICATED_PAD | CUTANEOUS | Status: DC | PRN

## 2022-10-18 MED ORDER — OXYTOCIN-SODIUM CHLORIDE 30-0.9 UT/500ML-% IV SOLN
2.5000 [IU]/h | INTRAVENOUS | Status: DC
  Administered 2022-10-18: 2.5 [IU]/h via INTRAVENOUS
  Filled 2022-10-18: qty 500

## 2022-10-18 MED ORDER — BENZOCAINE-MENTHOL 20-0.5 % EX AERO
1.0000 | INHALATION_SPRAY | CUTANEOUS | Status: DC | PRN
  Filled 2022-10-18: qty 56

## 2022-10-18 MED ORDER — FENTANYL-BUPIVACAINE-NACL 0.5-0.125-0.9 MG/250ML-% EP SOLN: 12.0000 mL/h | EPIDURAL | Status: DC | PRN

## 2022-10-18 MED ORDER — COCONUT OIL OIL: 1.0000 | TOPICAL_OIL | Status: DC | PRN

## 2022-10-18 MED ORDER — DIPHENHYDRAMINE HCL 25 MG PO CAPS: 25.0000 mg | ORAL_CAPSULE | Freq: Four times a day (QID) | ORAL | Status: DC | PRN

## 2022-10-18 MED ORDER — ONDANSETRON HCL 4 MG PO TABS: 4.0000 mg | ORAL_TABLET | ORAL | Status: DC | PRN

## 2022-10-18 MED ORDER — MISOPROSTOL 200 MCG PO TABS
ORAL_TABLET | ORAL | Status: AC
  Administered 2022-10-18: 800 ug
  Filled 2022-10-18: qty 4

## 2022-10-18 MED ORDER — ONDANSETRON HCL 4 MG/2ML IJ SOLN
4.0000 mg | Freq: Four times a day (QID) | INTRAMUSCULAR | Status: DC | PRN
  Administered 2022-10-18 (×2): 4 mg via INTRAVENOUS
  Filled 2022-10-18 (×2): qty 2

## 2022-10-18 MED ORDER — PRENATAL MULTIVITAMIN CH
1.0000 | ORAL_TABLET | Freq: Every day | ORAL | Status: DC
  Administered 2022-10-19: 1 via ORAL
  Filled 2022-10-18: qty 1

## 2022-10-18 MED ORDER — SODIUM CHLORIDE 0.9 % IV SOLN
5.0000 10*6.[IU] | Freq: Once | INTRAVENOUS | Status: AC
  Administered 2022-10-18: 5 10*6.[IU] via INTRAVENOUS
  Filled 2022-10-18: qty 5

## 2022-10-18 MED ORDER — SENNOSIDES-DOCUSATE SODIUM 8.6-50 MG PO TABS
2.0000 | ORAL_TABLET | Freq: Every day | ORAL | Status: DC
  Administered 2022-10-19: 2 via ORAL
  Filled 2022-10-18: qty 2

## 2022-10-18 MED ORDER — DOCUSATE SODIUM 100 MG PO CAPS: 100.0000 mg | ORAL_CAPSULE | Freq: Every day | ORAL | Status: DC

## 2022-10-18 MED ORDER — ACETAMINOPHEN 325 MG PO TABS
650.0000 mg | ORAL_TABLET | ORAL | Status: DC | PRN
  Administered 2022-10-18 (×2): 650 mg via ORAL
  Filled 2022-10-18 (×2): qty 2

## 2022-10-18 MED ORDER — TERBUTALINE SULFATE 1 MG/ML IJ SOLN: 0.2500 mg | Freq: Once | INTRAMUSCULAR | Status: DC | PRN

## 2022-10-18 MED ORDER — ACETAMINOPHEN 325 MG PO TABS: 650.0000 mg | ORAL_TABLET | ORAL | Status: DC | PRN

## 2022-10-18 MED ORDER — LIDOCAINE HCL (PF) 1 % IJ SOLN: 30.0000 mL | INTRAMUSCULAR | Status: DC | PRN

## 2022-10-18 MED ORDER — FENTANYL-BUPIVACAINE-NACL 0.5-0.125-0.9 MG/250ML-% EP SOLN
EPIDURAL | Status: DC | PRN
  Administered 2022-10-18: 12 mL/h via EPIDURAL

## 2022-10-18 MED ORDER — OXYTOCIN 10 UNIT/ML IJ SOLN
INTRAMUSCULAR | Status: AC
  Filled 2022-10-18: qty 2

## 2022-10-18 MED ORDER — OXYTOCIN-SODIUM CHLORIDE 30-0.9 UT/500ML-% IV SOLN
1.0000 m[IU]/min | INTRAVENOUS | Status: DC
  Administered 2022-10-18: 2 m[IU]/min via INTRAVENOUS
  Filled 2022-10-18: qty 500

## 2022-10-18 MED ORDER — IBUPROFEN 600 MG PO TABS
600.0000 mg | ORAL_TABLET | Freq: Four times a day (QID) | ORAL | Status: DC
  Administered 2022-10-18 – 2022-10-19 (×5): 600 mg via ORAL
  Filled 2022-10-18 (×5): qty 1

## 2022-10-18 MED ORDER — FENTANYL CITRATE (PF) 100 MCG/2ML IJ SOLN: 50.0000 ug | INTRAMUSCULAR | Status: DC | PRN

## 2022-10-18 MED ORDER — LACTATED RINGERS IV SOLN: INTRAVENOUS | Status: DC

## 2022-10-18 MED ORDER — AMMONIA AROMATIC IN INHA
RESPIRATORY_TRACT | Status: AC
  Filled 2022-10-18: qty 10

## 2022-10-18 MED ORDER — LIDOCAINE-EPINEPHRINE (PF) 1.5 %-1:200000 IJ SOLN
INTRAMUSCULAR | Status: DC | PRN
  Administered 2022-10-18: 3 mL via EPIDURAL

## 2022-10-18 MED ORDER — LACTATED RINGERS IV SOLN
500.0000 mL | Freq: Once | INTRAVENOUS | Status: AC
  Administered 2022-10-18: 500 mL via INTRAVENOUS

## 2022-10-18 MED ORDER — PENICILLIN G POT IN DEXTROSE 60000 UNIT/ML IV SOLN
3.0000 10*6.[IU] | INTRAVENOUS | Status: DC
  Administered 2022-10-18 (×3): 3 10*6.[IU] via INTRAVENOUS
  Filled 2022-10-18 (×3): qty 50

## 2022-10-18 MED ORDER — METHYLERGONOVINE MALEATE 0.2 MG/ML IJ SOLN
INTRAMUSCULAR | Status: AC
  Filled 2022-10-18: qty 1

## 2022-10-18 MED ORDER — ONDANSETRON HCL 4 MG/2ML IJ SOLN: 4.0000 mg | INTRAMUSCULAR | Status: DC | PRN

## 2022-10-18 MED ORDER — SIMETHICONE 80 MG PO CHEW: 80.0000 mg | CHEWABLE_TABLET | ORAL | Status: DC | PRN

## 2022-10-18 MED ORDER — OXYTOCIN BOLUS FROM INFUSION
333.0000 mL | Freq: Once | INTRAVENOUS | Status: AC
  Administered 2022-10-18: 333 mL via INTRAVENOUS

## 2022-10-18 MED ORDER — PHENYLEPHRINE 80 MCG/ML (10ML) SYRINGE FOR IV PUSH (FOR BLOOD PRESSURE SUPPORT): 80.0000 ug | PREFILLED_SYRINGE | INTRAVENOUS | Status: DC | PRN

## 2022-10-18 MED ORDER — LIDOCAINE HCL (PF) 1 % IJ SOLN
INTRAMUSCULAR | Status: AC
  Filled 2022-10-18: qty 30

## 2022-10-18 MED ORDER — LACTATED RINGERS IV SOLN: 125.0000 mL/h | INTRAVENOUS | Status: DC

## 2022-10-18 MED ORDER — CALCIUM CARBONATE ANTACID 500 MG PO CHEW: 2.0000 | CHEWABLE_TABLET | ORAL | Status: DC | PRN

## 2022-10-18 MED ORDER — FENTANYL-BUPIVACAINE-NACL 0.5-0.125-0.9 MG/250ML-% EP SOLN
EPIDURAL | Status: AC
  Filled 2022-10-18: qty 250

## 2022-10-18 MED ORDER — LACTATED RINGERS IV SOLN: 500.0000 mL | INTRAVENOUS | Status: DC | PRN

## 2022-10-18 MED ORDER — BUPIVACAINE HCL (PF) 0.25 % IJ SOLN
INTRAMUSCULAR | Status: DC | PRN
  Administered 2022-10-18: 5 mL via EPIDURAL

## 2022-10-18 MED ORDER — ZOLPIDEM TARTRATE 5 MG PO TABS: 5.0000 mg | ORAL_TABLET | Freq: Every evening | ORAL | Status: DC | PRN

## 2022-10-18 MED ORDER — LIDOCAINE HCL (PF) 1 % IJ SOLN
INTRAMUSCULAR | Status: DC | PRN
  Administered 2022-10-18: 3 mL via SUBCUTANEOUS

## 2022-10-18 MED ORDER — SOD CITRATE-CITRIC ACID 500-334 MG/5ML PO SOLN: 30.0000 mL | ORAL | Status: DC | PRN

## 2022-10-18 MED ORDER — DIBUCAINE (PERIANAL) 1 % EX OINT: 1.0000 | TOPICAL_OINTMENT | CUTANEOUS | Status: DC | PRN

## 2022-10-18 MED ORDER — DIPHENHYDRAMINE HCL 50 MG/ML IJ SOLN: 12.5000 mg | INTRAMUSCULAR | Status: DC | PRN
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
## 2022-10-18 NOTE — Progress Notes (Signed)
Labor Progress Note  Rachel Espinoza is a 31 y.o. [phone] at [redacted]w[redacted]d by ultrasound admitted for rupture of membranes  Subjective: feeling pressure   Objective: BP 106/70   Pulse (!) 111   Temp 98.2 F (36.8 C) (Oral)   Resp 17   Ht 4\' 9"  (1.448 m)   Wt 75.3 kg   LMP 01/23/2022 (Exact Date)   SpO2 98%   BMI 35.92 kg/m  Notable VS details: reviewed  Fetal Assessment: FHT:  FHR: 145 bpm, variability: moderate,  accelerations:  Present,  decelerations:  Present variables Category/reactivity:  Category I UC:   regular, every 2-3 minutes; 14 mu/min SVE:  9/90/0, clear fluid, posterior, soft.   Membrane status:SROM 12/17 at 2320 Amniotic color: clear  Labs: Lab Results  Component Value Date   WBC 14.7 (H) 10/18/2022   HGB 11.6 (L) 10/18/2022   HCT 34.5 (L) 10/18/2022   MCV 82.1 10/18/2022   PLT 229 10/18/2022    Assessment / Plan: Induction of labor due to PROM,  progressing well on pitocin  Labor:  no change in cervix, forebag ruptured.  Now active labor Preeclampsia:  no signs or symptoms of toxicity Fetal Wellbeing:  Category II Pain Control:  epidural I/D:   GBS POS Anticipated MOD:  NSVD  10/20/2022 Rachel Espinoza, CNM 10/18/2022, 3:24 PM
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
## 2022-10-18 NOTE — Progress Notes (Signed)
Labor Progress Note  Rachel Espinoza is a 31 y.o. [phone] at [redacted]w[redacted]d by ultrasound admitted for rupture of membranes  Subjective: feeling painful UCs, leaking small amount clear fluid.   Objective: BP 100/61 (BP Location: Left Arm)   Pulse (!) 105   Temp 98 F (36.7 C) (Oral)   Resp 17   Ht 4\' 9"  (1.448 m)   Wt 75.3 kg   LMP 01/23/2022 (Exact Date)   BMI 35.92 kg/m  Notable VS details: reviewed  Fetal Assessment: FHT:  FHR: 145 bpm, variability: moderate,  accelerations:  Present,  decelerations:  Absent Category/reactivity:  Category I UC:   regular, every 1-3 minutes; 14 mu/min SVE:   4/50/-2, clear fluid, posterior, soft.   Membrane status:SROM 12/17 at 2320 Amniotic color: clear  Labs: Lab Results  Component Value Date   WBC 14.7 (H) 10/18/2022   HGB 11.6 (L) 10/18/2022   HCT 34.5 (L) 10/18/2022   MCV 82.1 10/18/2022   PLT 229 10/18/2022    Assessment / Plan: Induction of labor due to PROM,  progressing well on pitocin  Labor:  no change in cervix, forebag ruptured.  Preeclampsia:  no signs or symptoms of toxicity Fetal Wellbeing:  Category I Pain Control:  considering epidural I/D:   GBS POS Anticipated MOD:  NSVD  10/20/2022 Renay Crammer, CNM 10/18/2022, 12:04 PM

## 2022-10-18 NOTE — Progress Notes (Signed)
Labor Check  Subj:  Complaints: }has no unusual complaints  Obj:  BP 115/62 (BP Location: Left Arm)   Pulse 95   Temp 97.9 F (36.6 C) (Oral)   Ht 4\' 9"  (1.448 m)   Wt 75.3 kg   LMP 01/23/2022 (Exact Date)   BMI 35.92 kg/m     Cervix: Dilation: 4 / Effacement (%): 60 / Station: -2  Baseline 01/25/2022: 145 bpm, Variability: Good {> 6 bpm), Accelerations: Reactive, and Decelerations: Absent Contractions: regular, every 5 minutes Overall assessment: reassuring    A/P: 31 y.o. 38 female at [redacted]w[redacted]d with None  1.  Labor: SROM pain controlled  Labor support without medications and Epidural 2.  [redacted]w[redacted]d assessment: Category I 3.  Group B Strep positive 4. Membranes ruptured, clear fluid x 8 hours 5.  Pain: level of pain (1-10, 10 severe), 5 6.  Recheck:Evaluated by digital exam. 7. Augmentation: IV Pitocin augmentation. and Intervention: increase Pitocin rate as needed and anticipate vaginal delivery  QVZ:DGLOVFI [facility] 10/18/2022 7:26 AM

## 2022-10-18 NOTE — OB Triage Note (Signed)
Patient is a [id number] at 37wk1d coming in for possible rupture of membranes. Patient reports +FM and ctx every 5 minutes. Patient denies bright red bleeding or headache. Initial FHT 145bpm.

## 2022-10-18 NOTE — Progress Notes (Signed)
Patient transferred from [facility] to [facility] for admission from [id number].

## 2022-10-18 NOTE — Anesthesia Preprocedure Evaluation (Signed)
Anesthesia Evaluation  Patient identified by MRN, date of birth, ID band Patient awake    Reviewed: Allergy & Precautions, H&P , NPO status , Patient's Chart, lab work & pertinent test results  Airway Mallampati: II  TM Distance: >3 FB     Dental  (+) Teeth Intact   Pulmonary    Pulmonary exam normal        Cardiovascular Normal cardiovascular exam     Neuro/Psych  PSYCHIATRIC DISORDERS  Depression       GI/Hepatic   Endo/Other    Renal/GU      Musculoskeletal   Abdominal   Peds  Hematology   Anesthesia Other Findings   Reproductive/Obstetrics (+) Pregnancy                             Anesthesia Physical Anesthesia Plan  ASA: 2  Anesthesia Plan: Epidural   Post-op Pain Management:    Induction:   PONV Risk Score and Plan:   Airway Management Planned:   Additional Equipment:   Intra-op Plan:   Post-operative Plan:   Informed Consent: I have reviewed the patients History and Physical, chart, labs and discussed the procedure including the risks, benefits and alternatives for the proposed anesthesia with the patient or authorized representative who has indicated his/her understanding and acceptance.     Dental Advisory Given  Plan Discussed with: Anesthesiologist and CRNA  Anesthesia Plan Comments:        Anesthesia Quick Evaluation

## 2022-10-18 NOTE — Anesthesia Procedure Notes (Signed)
Epidural Patient location during procedure: OB Start time: 10/18/2022 1:15 PM End time: 10/18/2022 1:30 PM  Staffing Anesthesiologist: Louie Boston, MD Resident/CRNA: Johney Maine D, CRNA Performed: resident/CRNA   Preanesthetic Checklist Completed: patient identified, IV checked, site marked, risks and benefits discussed, surgical consent, monitors and equipment checked, pre-op evaluation and timeout performed  Epidural Patient position: sitting Prep: ChloraPrep Patient monitoring: heart rate, continuous pulse ox and blood pressure Approach: midline Location: L3-L4 Injection technique: LOR saline  Needle:  Needle type: Tuohy  Needle gauge: 17 G Needle length: 9 cm and 9 Needle insertion depth: 5 cm Catheter type: closed end flexible Catheter size: 19 Gauge Catheter at skin depth: 12 cm Test dose: negative and 1.5% lidocaine with Epi 1:200 K  Assessment Events: blood not aspirated, no cerebrospinal fluid, injection not painful, no injection resistance, no paresthesia and negative IV test  Additional Notes 1 attempt Pt. Evaluated and documentation done after procedure finished. Patient identified. Risks/Benefits/Options discussed with patient including but not limited to bleeding, infection, nerve damage, paralysis, failed block, incomplete pain control, headache, blood pressure changes, nausea, vomiting, reactions to medication both or allergic, itching and postpartum back pain. Confirmed with bedside nurse the patient's most recent platelet count. Confirmed with patient that they are not currently taking any anticoagulation, have any bleeding history or any family history of bleeding disorders. Patient expressed understanding and wished to proceed. All questions were answered. Sterile technique was used throughout the entire procedure. Please see nursing notes for vital signs. Test dose was given through epidural catheter and negative prior to continuing to dose epidural or  start infusion. Warning signs of high block given to the patient including shortness of breath, tingling/numbness in hands, complete motor block, or any concerning symptoms with instructions to call for help. Patient was given instructions on fall risk and not to get out of bed. All questions and concerns addressed with instructions to call with any issues or inadequate analgesia.    Patient tolerated the insertion well without immediate complications.Reason for block:procedure for pain

## 2022-10-19 LAB — FETAL SCREEN: Fetal Screen: NEGATIVE

## 2022-10-19 LAB — CBC
HCT: 31.7 % — ABNORMAL LOW (ref 36.0–46.0)
Hemoglobin: 10.7 g/dL — ABNORMAL LOW (ref 12.0–15.0)
MCH: 27.7 pg (ref 26.0–34.0)
MCHC: 33.8 g/dL (ref 30.0–36.0)
MCV: 82.1 fL (ref 80.0–100.0)
Platelets: 211 10*3/uL (ref 150–400)
RBC: 3.86 MIL/uL — ABNORMAL LOW (ref 3.87–5.11)
RDW: 13.1 % (ref 11.5–15.5)
WBC: 14.6 10*3/uL — ABNORMAL HIGH (ref 4.0–10.5)
nRBC: 0 % (ref 0.0–0.2)

## 2022-10-19 MED ORDER — BENZOCAINE-MENTHOL 20-0.5 % EX AERO: 1.0000 | INHALATION_SPRAY | CUTANEOUS | Status: DC | PRN

## 2022-10-19 MED ORDER — DIBUCAINE (PERIANAL) 1 % EX OINT: 1.0000 | TOPICAL_OINTMENT | CUTANEOUS | Status: DC | PRN

## 2022-10-19 MED ORDER — SENNOSIDES-DOCUSATE SODIUM 8.6-50 MG PO TABS: 2.0000 | ORAL_TABLET | Freq: Every day | ORAL | Status: DC

## 2022-10-19 MED ORDER — RHO D IMMUNE GLOBULIN 1500 UNIT/2ML IJ SOSY
300.0000 ug | PREFILLED_SYRINGE | Freq: Once | INTRAMUSCULAR | Status: AC
  Administered 2022-10-19: 300 ug via INTRAVENOUS
  Filled 2022-10-19: qty 2

## 2022-10-19 MED ORDER — SIMETHICONE 80 MG PO CHEW: 80.0000 mg | CHEWABLE_TABLET | ORAL | 0 refills | Status: DC | PRN

## 2022-10-19 MED ORDER — COCONUT OIL OIL: 1.0000 | TOPICAL_OIL | 0 refills | Status: DC | PRN

## 2022-10-19 MED ORDER — IBUPROFEN 600 MG PO TABS: 600.0000 mg | ORAL_TABLET | Freq: Four times a day (QID) | ORAL | 0 refills | Status: DC

## 2022-10-19 MED ORDER — ACETAMINOPHEN 325 MG PO TABS: 650.0000 mg | ORAL_TABLET | ORAL | Status: DC | PRN

## 2022-10-19 MED ORDER — WITCH HAZEL-GLYCERIN EX PADS: 1.0000 | MEDICATED_PAD | CUTANEOUS | 12 refills | Status: DC | PRN

## 2022-10-19 NOTE — Anesthesia Postprocedure Evaluation (Signed)
Anesthesia Post Note  Patient: Rachel Espinoza  Procedure(s) Performed: AN AD HOC LABOR EPIDURAL  Patient location during evaluation: Mother Baby Anesthesia Type: Epidural Level of consciousness: awake and alert Pain management: pain level controlled Vital Signs Assessment: post-procedure vital signs reviewed and stable Respiratory status: spontaneous breathing, nonlabored ventilation and respiratory function stable Cardiovascular status: stable Postop Assessment: no headache, no backache and epidural receding Anesthetic complications: no   No notable events documented.   Last Vitals:  Vitals:   10/19/22 0305 10/19/22 0801  BP: 102/61 106/63  Pulse: 78 74  Resp: 18 20  Temp: 36.8 C 36.9 C  SpO2: 99% 99%    Last Pain:  Vitals:   10/19/22 0801  TempSrc: Oral  PainSc:                  Rica Mast

## 2022-10-19 NOTE — Discharge Instructions (Signed)
Discharge instructions:   Call office if you have any of the following:  headache, visual changes, fever >101.0 F, chills, breast concerns (engorgement, mastitis) excessive vaginal bleeding, incision drainage or problems, leg pain or redness, depression or any other concerns.   Activity: Do not lift > 10 lbs for 6 weeks.  No intercourse or tampons for 6 weeks.  No driving for 1-2 weeks or while taking pain medication. No strenuous activity or heavy lifting for 6 weeks.  No swimming pools, hot tubs or tub baths- showers only.    It is normal to bleed for up to 6 weeks. You should not soak through more than 1 pad in 1 hour.   Continue prenatal vitamin. Increase calories and fluids while breastfeeding.  Your milk will come in, in the next couple of days (right now it is colostrum).  You may have a slight fever when your milk comes in, but it should go away on its own.   If it does not, and rises above 101 F please call the doctor.  You will also feel achy and your breasts will be firm. They will also start to leak.  If you are breastfeeding, continue as you have been and you can pump/express milk for comfort.   For concerns about your baby, please call your pediatrician For breastfeeding concerns, the lactation consultant can be reached at [phone]  Postpartum blues (feelings of happy one minute and sad another minute) are normal for the first few weeks but if it gets worse let your doctor know.    Instrucciones de descarga:  Llame a la oficina si tiene alguno de los siguientes: dolor de cabeza, cambios visuales, fiebre >101.0 F, escalofros, problemas con los senos (ingurgitacin, mastitis) sangrado vaginal excesivo, problemas o drenaje de la incisin, dolor o enrojecimiento en las piernas, depresin o cualquier otra inquietud.  Actividad: No levante ms de 10 libras durante 6 semanas. Sin relaciones sexuales ni tampones durante 6 semanas. No conducir durante 1 a 2 semanas o  mientras est tomando analgsicos. No realizar actividades extenuantes ni levantar objetos pesados durante 6 semanas. No se permiten piscinas, jacuzzis ni baeras, solo duchas.  Es normal Orthoptist por 6 semanas. No debes empapar ms de 1 toalla sanitaria en 1 hora.  Continuar con la vitamina prenatal. Aumente las caloras y los lquidos durante la Market researcher.  Le bajar la [employer] prximos das (ahora mismo es Product manager). Es posible que tenga un poco de fiebre cuando le baje la [place] debera desaparecer por s sola. Si no es as y [employer] 101 F, llame al mdico. Tambin sentirs dolor y tus senos estarn firmes. Tambin empezarn a Field seismologist. Si est amamantando, contine como [place] y podr extraerse leche para su comodidad.  Si tiene inquietudes sobre su beb, llame a su pediatra. Si tiene inquietudes [employer], puede comunicarse con la asesora en lactancia al [phone].  La tristeza posparto (sentimientos de felicidad un minuto y tristeza otro minuto) es normal durante las primeras semanas, pero si empeora, infrmeselo a su mdico.
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
## 2022-10-20 LAB — RHOGAM INJECTION: Unit division: 0

## 2022-10-22 ENCOUNTER — Ambulatory Visit: Payer: Self-pay

## 2022-11-12 NOTE — Addendum Note (Signed)
Addended by: Ardice Boyan on: 11/12/2022 03:24 PM   Modules accepted: Orders  

## 2022-12-16 ENCOUNTER — Telehealth: Payer: Self-pay | Admitting: Family Medicine

## 2022-12-16 NOTE — Telephone Encounter (Signed)
PP appt was scheduled.

## 2023-01-05 ENCOUNTER — Encounter: Payer: Self-pay | Admitting: Advanced Practice Midwife

## 2023-01-05 ENCOUNTER — Ambulatory Visit: Payer: Self-pay | Admitting: Advanced Practice Midwife

## 2023-01-05 VITALS — BP 90/53 | HR 87 | Temp 97.1°F | Resp 16 | Ht 60.0 in | Wt 156.0 lb

## 2023-01-05 DIAGNOSIS — Z30017 Encounter for initial prescription of implantable subdermal contraceptive: Secondary | ICD-10-CM

## 2023-01-05 DIAGNOSIS — Z3009 Encounter for other general counseling and advice on contraception: Secondary | ICD-10-CM

## 2023-01-05 LAB — WET PREP FOR TRICH, YEAST, CLUE
Trichomonas Exam: NEGATIVE
Yeast Exam: NEGATIVE

## 2023-01-05 LAB — HEMOGLOBIN, FINGERSTICK: Hemoglobin: 12.8 g/dL (ref 11.1–15.9)

## 2023-01-05 LAB — PREGNANCY, URINE: Preg Test, Ur: NEGATIVE

## 2023-01-05 MED ORDER — ETONOGESTREL 68 MG ~~LOC~~ IMPL
68.0000 mg | DRUG_IMPLANT | Freq: Once | SUBCUTANEOUS | Status: AC
  Administered 2023-01-05: 68 mg via SUBCUTANEOUS
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
## 2023-01-05 NOTE — Progress Notes (Signed)
Patient here for postpartum visit for annual physical and Nexplanon insertion.   Hgb (12.8) - no treatment indicated (reviewed).    Pregnancy test = negative (reviewed)   Wet mount reviewed = negative (no treatment indicated).   Nexplanon consent forms signed. Nexplanon post instructions explained and handed to patient. Nexplanon card handed. Questions answered.    Al Decant, RN

## 2023-08-22 ENCOUNTER — Ambulatory Visit (LOCAL_COMMUNITY_HEALTH_CENTER): Payer: Self-pay | Admitting: Family Medicine

## 2023-08-22 VITALS — BP 140/91 | HR 123 | Wt 159.0 lb

## 2023-08-22 DIAGNOSIS — Z309 Encounter for contraceptive management, unspecified: Secondary | ICD-10-CM

## 2023-08-22 DIAGNOSIS — F99 Mental disorder, not otherwise specified: Secondary | ICD-10-CM

## 2023-08-22 DIAGNOSIS — Z91410 Personal history of adult physical and sexual abuse: Secondary | ICD-10-CM

## 2023-08-22 DIAGNOSIS — F32A Depression, unspecified: Secondary | ICD-10-CM

## 2023-08-22 DIAGNOSIS — Z975 Presence of (intrauterine) contraceptive device: Secondary | ICD-10-CM

## 2023-08-22 MED ORDER — ARIPIPRAZOLE 5 MG PO TABS: 5.0000 mg | ORAL_TABLET | Freq: Every day | ORAL | 2 refills | Status: DC

## 2023-08-22 MED ORDER — ARIPIPRAZOLE 5 MG PO TABS: 5.0000 mg | ORAL_TABLET | Freq: Every day | ORAL | 0 refills | Status: AC
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
## 2023-08-24 ENCOUNTER — Ambulatory Visit: Payer: Self-pay | Admitting: Licensed Clinical Social Worker
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
## 2023-08-25 ENCOUNTER — Telehealth: Payer: Self-pay | Admitting: Licensed Clinical Social Worker

## 2023-08-25 ENCOUNTER — Telehealth: Payer: Self-pay | Admitting: Family Medicine

## 2023-08-25 ENCOUNTER — Ambulatory Visit: Payer: Self-pay | Admitting: Licensed Clinical Social Worker

## 2023-08-25 NOTE — Telephone Encounter (Addendum)
Patient did not show to appointment. This was patient's second missed appt. - she missed scheduled appt. On 08/24/23.  LCSW with assistance from Marlene Yemen, Interpreter attempted to call the patient 2x voicemail is not set up and we were not able to leave a message.

## 2023-08-25 NOTE — Telephone Encounter (Signed)
Attempted to contact patient- no answer on phone and no VM.  I called her husband who answered- and instructed him to have his wife call the HD.  Pt has missed two appointments with LCSW, and reported she has not gone to [facility]. Pt seen on Monday for mental health eval- pt is 9 months post partum- medications were prescribed and patient extensively counseled that I could not give more than 30 days of meds.  Due to language barrier, interpreter Marlene Yemen assisted in phone call.  [facility] FNP-C

## 2023-08-30 ENCOUNTER — Telehealth: Payer: Self-pay | Admitting: Family Medicine

## 2023-08-30 NOTE — Telephone Encounter (Signed)
Attempted to call patient to encourage her to go to [facility] to establish care. No answer. Pt does not have voicemail set up.   [facility] FNP-C

## 2024-03-02 IMAGING — US US OB COMP LESS 14 WK
1 series · 15 of 28 positions shown · non-contrast
Comparison: None Available.

CLINICAL DATA: Pelvic pain for 3 days and vaginal spotting.

EXAM:
OBSTETRIC <14 WK ULTRASOUND
TECHNIQUE: Transabdominal ultrasound was performed for evaluation of the
gestation as well as the maternal uterus and adnexal regions.

[Series 1: us ob comp less 14 wks · 15 of 38 slices shown]
[im 1/38]
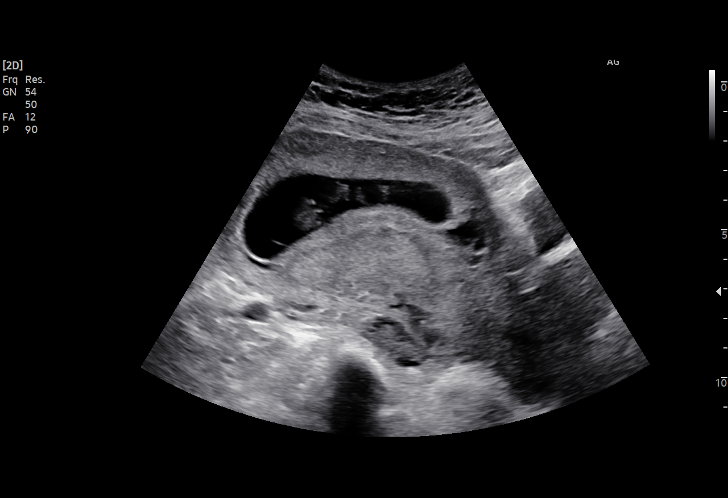
[im 3/38]
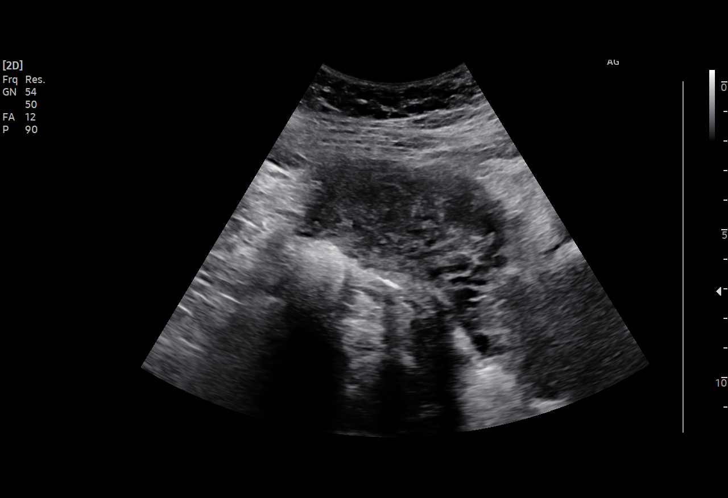
[im 6/38]
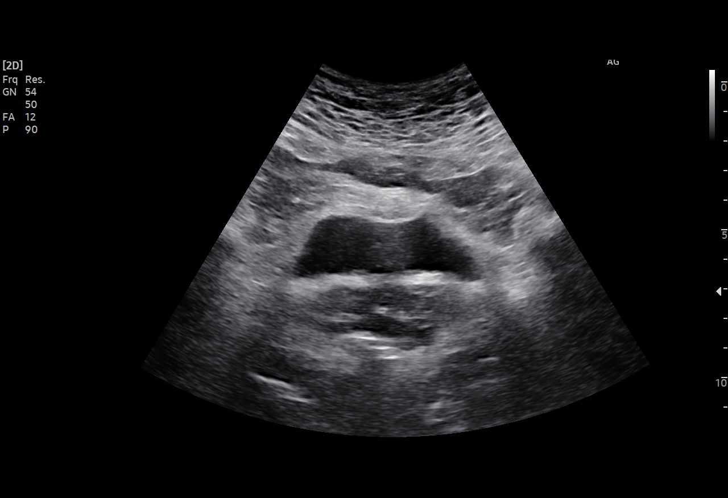
[im 9/38]
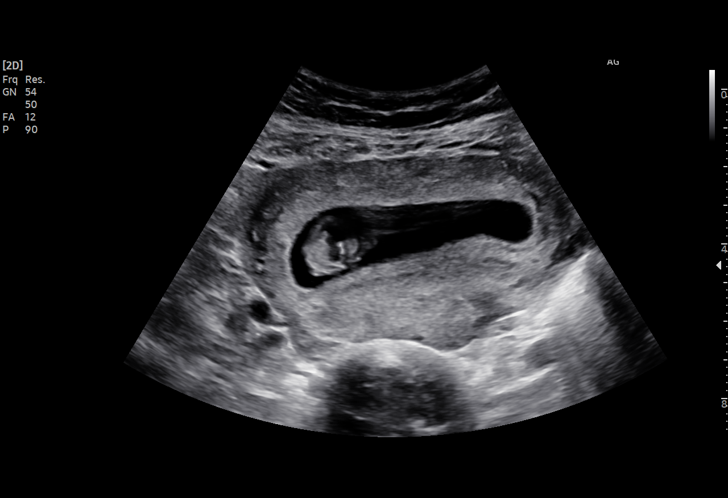
[im 11/38]
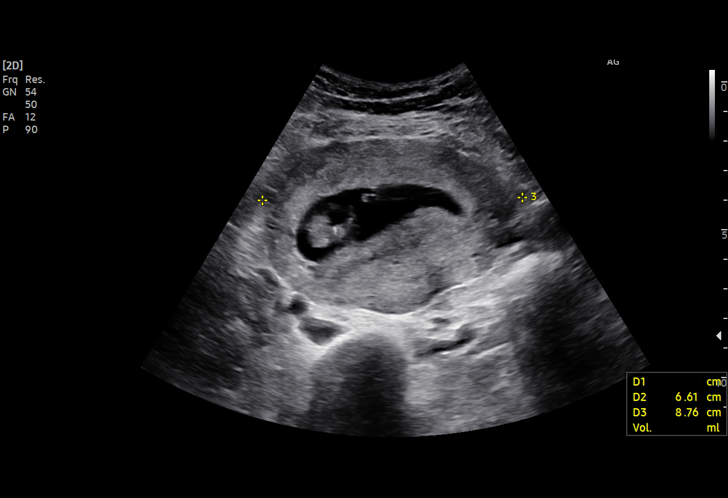
[im 14/38]
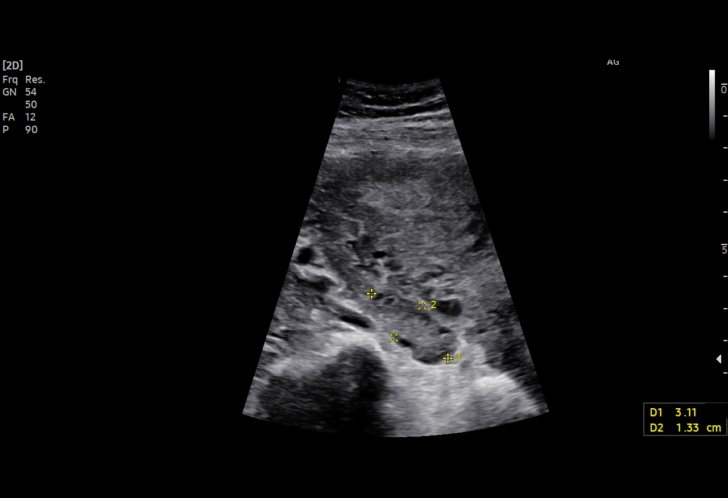
[im 17/38]
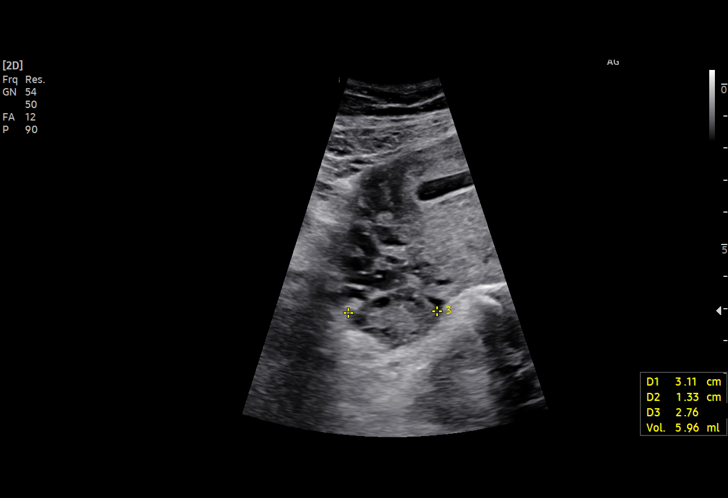
[im 20/38]
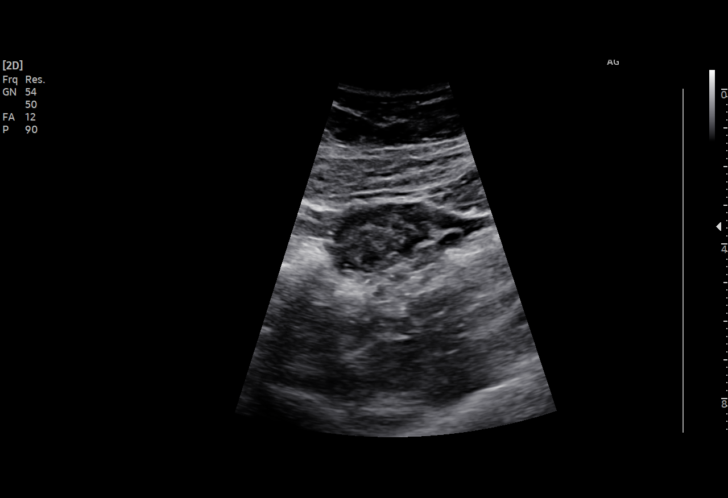
[im 21/38]
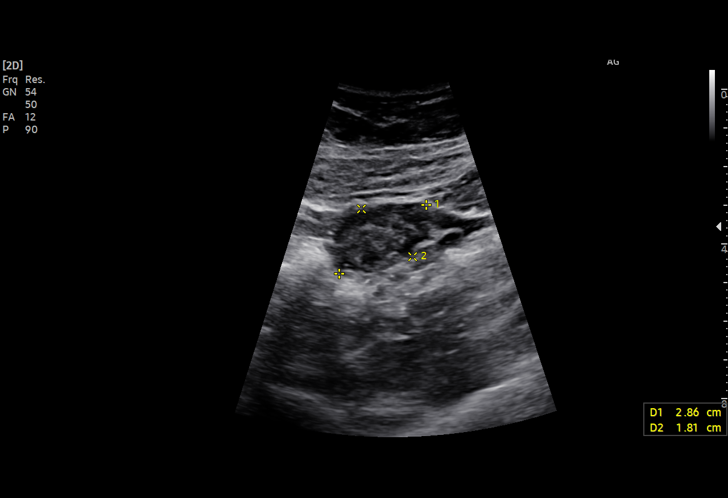
[im 24/38]
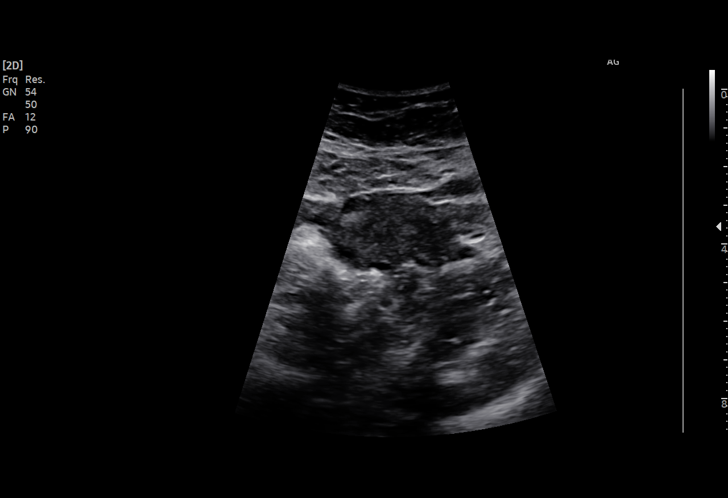
[im 27/38]
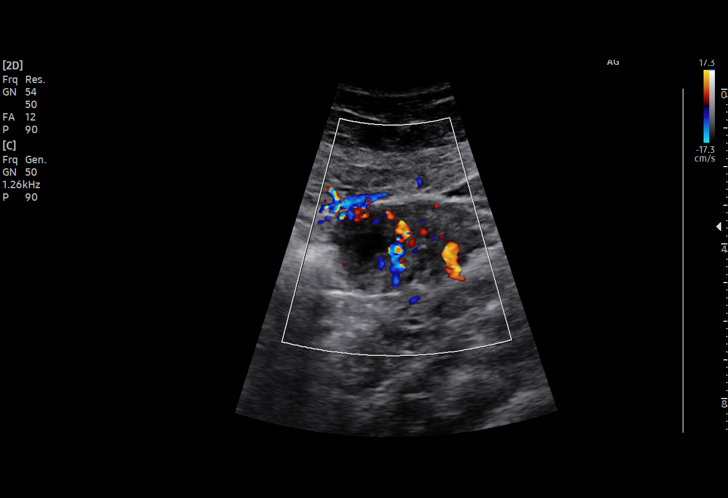
[im 29/38]
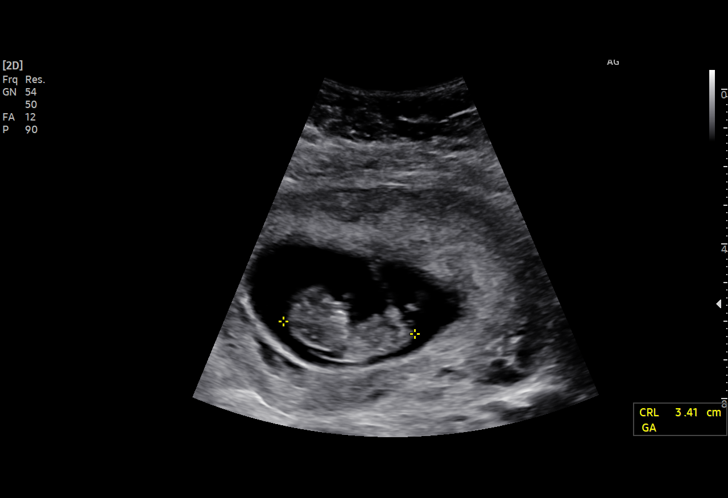
[im 32/38]
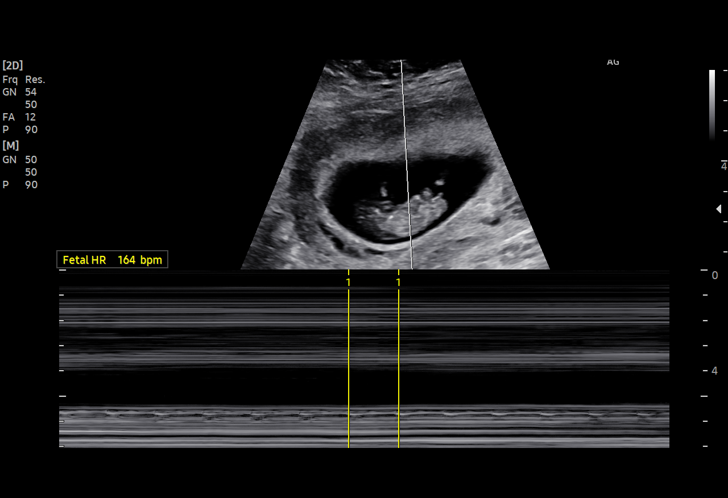
[im 35/38]
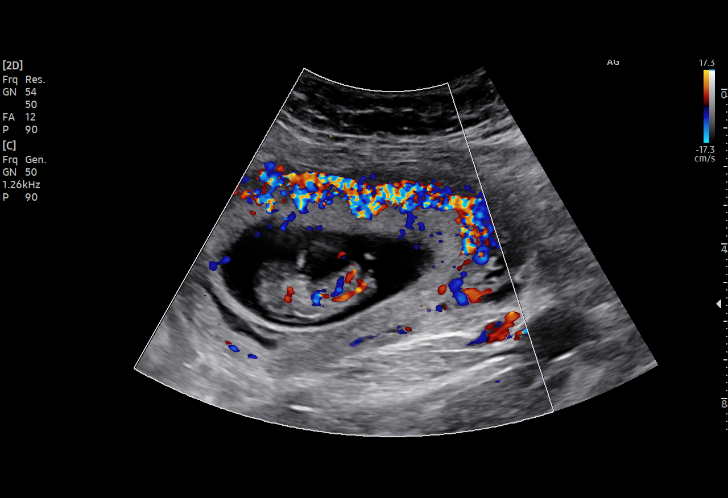
[im 38/38]
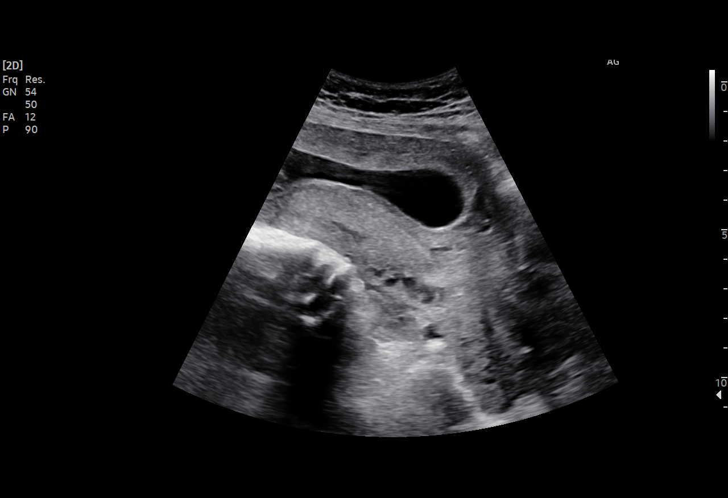

[15 of 28 positions shown; findings below may reference images not displayed]

FINDINGS: Intrauterine gestational sac: Present

Yolk sac:  Present

Embryo:  Present

Cardiac Activity: Present

Heart Rate: 164 bpm

CRL:   34 mm   10 w 2 d                  US EDC: 11/07/2022

Subchorionic hemorrhage:  Small.

Maternal uterus/adnexae: The ovaries are normal. No free pelvic
fluid collections.
IMPRESSION: 1. Single living intrauterine fetus estimated at 10 weeks and 2 days
gestation.
2. Small amount of subchorionic hemorrhage.
3. Normal ovaries.

## 2024-08-01 ENCOUNTER — Encounter: Payer: Self-pay | Admitting: Intensive Care

## 2024-08-01 ENCOUNTER — Emergency Department
Admission: EM | Admit: 2024-08-01 | Discharge: 2024-08-01 | Disposition: A | Discharge status: Home / Self Care | Payer: Self-pay | Attending: Emergency Medicine | Admitting: Emergency Medicine

## 2024-08-01 ENCOUNTER — Emergency Department: Payer: Self-pay

## 2024-08-01 ENCOUNTER — Other Ambulatory Visit: Payer: Self-pay

## 2024-08-01 DIAGNOSIS — R42 Dizziness and giddiness: Secondary | ICD-10-CM | POA: Insufficient documentation

## 2024-08-01 DIAGNOSIS — R079 Chest pain, unspecified: Secondary | ICD-10-CM | POA: Insufficient documentation

## 2024-08-01 DIAGNOSIS — R109 Unspecified abdominal pain: Secondary | ICD-10-CM | POA: Insufficient documentation

## 2024-08-01 DIAGNOSIS — R0682 Tachypnea, not elsewhere classified: Secondary | ICD-10-CM | POA: Insufficient documentation

## 2024-08-01 DIAGNOSIS — F419 Anxiety disorder, unspecified: Secondary | ICD-10-CM | POA: Insufficient documentation

## 2024-08-01 LAB — CBC
HCT: 38.9 % (ref 36.0–46.0)
Hemoglobin: 13.6 g/dL (ref 12.0–15.0)
MCH: 27.9 pg (ref 26.0–34.0)
MCHC: 35 g/dL (ref 30.0–36.0)
MCV: 79.7 fL — ABNORMAL LOW (ref 80.0–100.0)
Platelets: 297 K/uL (ref 150–400)
RBC: 4.88 MIL/uL (ref 3.87–5.11)
RDW: 12.2 % (ref 11.5–15.5)
WBC: 10.1 K/uL (ref 4.0–10.5)
nRBC: 0 % (ref 0.0–0.2)

## 2024-08-01 LAB — BASIC METABOLIC PANEL WITH GFR
Anion gap: 12 (ref 5–15)
BUN: 16 mg/dL (ref 6–20)
CO2: 19 mmol/L — ABNORMAL LOW (ref 22–32)
Calcium: 9.4 mg/dL (ref 8.9–10.3)
Chloride: 105 mmol/L (ref 98–111)
Creatinine, Ser: 0.7 mg/dL (ref 0.44–1.00)
GFR, Estimated: >60 mL/min (ref >60)
Glucose, Bld: 98 mg/dL (ref 70–99)
Potassium: 3.2 mmol/L — ABNORMAL LOW (ref 3.5–5.1)
Sodium: 136 mmol/L (ref 135–145)

## 2024-08-01 LAB — TROPONIN I (HIGH SENSITIVITY): Troponin I (High Sensitivity): <2 ng/L (ref <18)

## 2024-08-01 MED ORDER — POTASSIUM CHLORIDE CRYS ER 20 MEQ PO TBCR
40.0000 meq | EXTENDED_RELEASE_TABLET | Freq: Once | ORAL | Status: AC
  Administered 2024-08-01: 40 meq via ORAL
  Filled 2024-08-01: qty 2

## 2024-08-01 MED ORDER — HYDROXYZINE PAMOATE 25 MG PO CAPS: 25.0000 mg | ORAL_CAPSULE | Freq: Three times a day (TID) | ORAL | 1 refills | Status: AC | PRN

## 2024-08-01 NOTE — ED Provider Notes (Signed)
 [facility] Emergency Department Provider Note     Event Date/Time   First MD Initiated Contact with Patient 08/01/24 1521     (approximate)   History   Panic Attack   HPI  History limited by step and this language.  Interpreter Florinda) present for interview and exam.  Rachel Espinoza is a 33 y.o. female with a history of depression, anxiety, and scoliosis, presents to the ED for evaluation of sudden onset of chest pain.  Patient began to feel unwell while she was at a [employer].  She endorsed chest pressure as well as dizziness and rapid breathing.  She also endorsed somewhat of an abdominal pressure.  She denies any frank shortness of breath, syncope, or diaphoresis.  Patient reports generalized pressure to her body.  She does endorse history of panic attacks and reports symptoms are resolved at this time patient presents to the ED via EMS for evaluation of her symptoms.  Physical Exam   Triage Vital Signs: ED Triage Vitals  Encounter Vitals Group     BP 08/01/24 1405 115/81     Girls Systolic BP Percentile --      Girls Diastolic BP Percentile --      Boys Systolic BP Percentile --      Boys Diastolic BP Percentile --      Pulse Rate 08/01/24 1405 100     Resp 08/01/24 1405 20     Temp 08/01/24 1405 98.2 F (36.8 C)     Temp Source 08/01/24 1405 Oral     SpO2 08/01/24 1405 100 %     Weight 08/01/24 1408 143 lb (64.9 kg)     Height 08/01/24 1408 5' (1.524 m)     Head Circumference --      Peak Flow --      Pain Score 08/01/24 1406 7     Pain Loc --      Pain Education --      Exclude from Growth Chart --     Most recent vital signs: Vitals:   08/01/24 1547 08/01/24 1629  BP:  133/80  Pulse:  92  Resp:  18  Temp:  98 F (36.7 C)  SpO2: 100% 99%    General Awake, no distress. NAD HEENT NCAT. PERRL. EOMI. No rhinorrhea. Mucous membranes are moist.  CV:  Good peripheral perfusion. RRR RESP:  Normal effort.  CTA MSK:  AROM NEURO: Cranial nerves II to XII grossly intact.   ED Results / Procedures / Treatments   Labs (all labs ordered are listed, but only abnormal results are displayed) Labs Reviewed  BASIC METABOLIC PANEL WITH GFR - Abnormal; Notable for the following components:      Result Value   Potassium 3.2 (*)    CO2 19 (*)    All other components within normal limits  CBC - Abnormal; Notable for the following components:   MCV 79.7 (*)    All other components within normal limits  TROPONIN I (HIGH SENSITIVITY)    EKG  Vent. rate 120 BPM PR interval 136 ms QRS duration 66 ms QT/QTcB 332/469 ms P-R-T axes 68 87 69 Sinus tachycardia Nonspecific T wave abnormality Abnormal ECG When compared with ECG of 07-Jun-2022 21:27, QRS axis Shifted left Nonspecific T wave abnormality, improved in Lateral leads No STEMI  RADIOLOGY  I personally viewed and evaluated these images as part of my medical decision making, as well as reviewing the written report by the  radiologist.  ED Provider Interpretation: no acute findings  DG Chest 2 View Result Date: 08/01/2024 CLINICAL DATA:  Chest pain, tachypnea EXAM: CHEST - 2 VIEW COMPARISON:  None Available. FINDINGS: Frontal and lateral views of the chest demonstrate an unremarkable cardiac silhouette. No airspace disease, effusion, or pneumothorax. No acute bony abnormalities. IMPRESSION: 1. No acute intrathoracic process. Electronically Signed   By: Ozell Daring M.D.   On: 08/01/2024 15:13    PROCEDURES:  Critical Care performed: No  Procedures   MEDICATIONS ORDERED IN ED: Medications  potassium chloride SA (KLOR-CON M) CR tablet 40 mEq (40 mEq Oral Given 08/01/24 1627)     IMPRESSION / MDM / ASSESSMENT AND PLAN / ED COURSE  I reviewed the triage vital signs and the nursing notes.                              Differential diagnosis includes, but is not limited to,  ACS, aortic dissection, pulmonary embolism, cardiac  tamponade, pneumothorax, pneumonia, pericarditis, myocarditis, GI-related causes including esophagitis/gastritis, and musculoskeletal chest wall pain.     Patient's presentation is most consistent with acute complicated illness / injury requiring diagnostic workup.  Patient's diagnosis is consistent with nonspecific chest pain.  Patient was symptoms now completely resolved, and in no acute distress.  Patient presented with reports of chest heaviness, muscle spasms and general malaise.  Symptoms were resolved at the time of my evaluation.  Workup including cardiac enzymes, EKG, basic labs are normal and reassuring.  No chest x-ray evidence of any acute intrathoracic process. Patient was found to have a mildly decreased potassium so p.o. supplementation was provided.  Patient otherwise stable at this time for outpatient management doubt ACS and symptoms have resolved, and likely represent panic attack.  Patient will be discharged home with prescriptions for hydroxyzine for her anxiety symptoms. Patient is to follow up with her PCP as suggested, as needed or otherwise directed. Patient is given ED precautions to return to the ED for any worsening or new symptoms.  Clinical Course as of 08/01/24 2354  Wed Aug 01, 2024  1521 DG Chest 2 View [JM]    Clinical Course User Index [JM] Loyd, Candida LULLA Kings, [place]    FINAL CLINICAL IMPRESSION(S) / ED DIAGNOSES   Final diagnoses:  Nonspecific chest pain  Anxiety     Rx / DC Orders   ED Discharge Orders          Ordered    hydrOXYzine (VISTARIL) 25 MG capsule  3 times daily PRN        08/01/24 1614             Note:  This document was prepared using Dragon voice recognition software and may include unintentional dictation errors.    Loyd Candida LULLA Kings, PA-C 08/01/24 2357    Waymond Lorelle Cummins, MD 08/03/24 [phone]

## 2024-08-01 NOTE — ED Notes (Signed)
 Patient is being non compliant with answering questions and having blood drawn. No blood drawn at this time

## 2024-08-01 NOTE — ED Notes (Signed)
 Pt discharged at this time. RN reviewed discharge instructions with pt. Pt verbalized understanding. Vital signs taken. RR even and unlabored. Pt denies any questions or needs at this time. Pt ambulatory at discharge.

## 2024-08-01 NOTE — ED Triage Notes (Signed)
 Interpretor on stick used. Bernice # [id number]  Patient was at [facility] and started to feel unwell. Started to feel chest pressure, dizziness, rapid breathing, and abdominal pressure. Reports feeling pressure all over body    History of panic attacks

## 2024-08-01 NOTE — Discharge Instructions (Addendum)
 Your exam, labs, EKG, and chest x-ray are all normal and reassuring at this time.  No signs of a serious underlying cause for your symptoms.  You may have experienced a panic attack.  Take the prescription anxiety medicine as directed.  Follow-up with the local acuity clinic as suggested.  Su examen, anlisis de laboratorio, electrocardiograma y radiografa de trax son normales y tranquilizadores en este momento. No hay indicios de una causa subyacente grave para sus sntomas. Es posible que haya sufrido un ataque de pnico. Tome el ansioltico recetado segn las indicaciones. Acuda a la clnica de antigua and barbuda local segn lo recomendado.
